# Patient Record
Sex: Male | Born: 1970 | Race: White | Hispanic: No | Marital: Single | Smoking: Former smoker
Health system: Southern US, Community
[De-identification: ages and names within clinical notes are randomized; demographics above are authoritative.]

## PROBLEM LIST (undated history)

## (undated) DIAGNOSIS — L409 Psoriasis, unspecified: Secondary | ICD-10-CM

## (undated) DIAGNOSIS — I10 Essential (primary) hypertension: Secondary | ICD-10-CM

---

## 2020-06-28 ENCOUNTER — Emergency Department: Payer: No Typology Code available for payment source

## 2020-06-28 ENCOUNTER — Encounter: Payer: Self-pay | Admitting: Medical Oncology

## 2020-06-28 ENCOUNTER — Observation Stay: Payer: No Typology Code available for payment source

## 2020-06-28 ENCOUNTER — Other Ambulatory Visit: Payer: Self-pay

## 2020-06-28 ENCOUNTER — Inpatient Hospital Stay
Admission: EM | Admit: 2020-06-28 | Discharge: 2020-06-29 | DRG: 151 | Discharge status: Against Medical Advice | Payer: No Typology Code available for payment source | Attending: Internal Medicine | Admitting: Internal Medicine

## 2020-06-28 DIAGNOSIS — K921 Melena: Secondary | ICD-10-CM | POA: Diagnosis not present

## 2020-06-28 DIAGNOSIS — F1010 Alcohol abuse, uncomplicated: Secondary | ICD-10-CM | POA: Diagnosis not present

## 2020-06-28 DIAGNOSIS — R531 Weakness: Secondary | ICD-10-CM

## 2020-06-28 DIAGNOSIS — K920 Hematemesis: Secondary | ICD-10-CM | POA: Diagnosis not present

## 2020-06-28 DIAGNOSIS — Z79899 Other long term (current) drug therapy: Secondary | ICD-10-CM

## 2020-06-28 DIAGNOSIS — Z8719 Personal history of other diseases of the digestive system: Secondary | ICD-10-CM

## 2020-06-28 DIAGNOSIS — K766 Portal hypertension: Secondary | ICD-10-CM | POA: Diagnosis present

## 2020-06-28 DIAGNOSIS — Z20822 Contact with and (suspected) exposure to covid-19: Secondary | ICD-10-CM | POA: Diagnosis present

## 2020-06-28 DIAGNOSIS — F10231 Alcohol dependence with withdrawal delirium: Secondary | ICD-10-CM

## 2020-06-28 DIAGNOSIS — M47812 Spondylosis without myelopathy or radiculopathy, cervical region: Secondary | ICD-10-CM | POA: Diagnosis present

## 2020-06-28 DIAGNOSIS — F17290 Nicotine dependence, other tobacco product, uncomplicated: Secondary | ICD-10-CM | POA: Diagnosis present

## 2020-06-28 DIAGNOSIS — K8020 Calculus of gallbladder without cholecystitis without obstruction: Secondary | ICD-10-CM | POA: Diagnosis present

## 2020-06-28 DIAGNOSIS — K7030 Alcoholic cirrhosis of liver without ascites: Secondary | ICD-10-CM | POA: Diagnosis present

## 2020-06-28 DIAGNOSIS — G8929 Other chronic pain: Secondary | ICD-10-CM | POA: Diagnosis present

## 2020-06-28 DIAGNOSIS — R188 Other ascites: Secondary | ICD-10-CM | POA: Diagnosis present

## 2020-06-28 DIAGNOSIS — G959 Disease of spinal cord, unspecified: Secondary | ICD-10-CM | POA: Diagnosis present

## 2020-06-28 DIAGNOSIS — L4050 Arthropathic psoriasis, unspecified: Secondary | ICD-10-CM | POA: Diagnosis present

## 2020-06-28 DIAGNOSIS — R7989 Other specified abnormal findings of blood chemistry: Secondary | ICD-10-CM | POA: Diagnosis present

## 2020-06-28 DIAGNOSIS — K760 Fatty (change of) liver, not elsewhere classified: Secondary | ICD-10-CM | POA: Diagnosis present

## 2020-06-28 DIAGNOSIS — R040 Epistaxis: Secondary | ICD-10-CM | POA: Diagnosis not present

## 2020-06-28 DIAGNOSIS — I10 Essential (primary) hypertension: Secondary | ICD-10-CM | POA: Diagnosis present

## 2020-06-28 DIAGNOSIS — G459 Transient cerebral ischemic attack, unspecified: Secondary | ICD-10-CM | POA: Diagnosis not present

## 2020-06-28 DIAGNOSIS — L409 Psoriasis, unspecified: Secondary | ICD-10-CM | POA: Diagnosis present

## 2020-06-28 DIAGNOSIS — R200 Anesthesia of skin: Secondary | ICD-10-CM

## 2020-06-28 DIAGNOSIS — Z9119 Patient's noncompliance with other medical treatment and regimen: Secondary | ICD-10-CM

## 2020-06-28 DIAGNOSIS — R1010 Upper abdominal pain, unspecified: Secondary | ICD-10-CM

## 2020-06-28 HISTORY — DX: Essential (primary) hypertension: I10

## 2020-06-28 HISTORY — DX: Psoriasis, unspecified: L40.9

## 2020-06-28 LAB — COMPREHENSIVE METABOLIC PANEL
ALT: 48 U/L — ABNORMAL HIGH (ref 0–44)
ALT: 47 U/L — ABNORMAL HIGH (ref 0–44)
AST: 77 U/L — ABNORMAL HIGH (ref 15–41)
AST: 70 U/L — ABNORMAL HIGH (ref 15–41)
Albumin: 3.9 g/dL (ref 3.5–5.0)
Albumin: 3.8 g/dL (ref 3.5–5.0)
Alkaline Phosphatase: 89 U/L (ref 38–126)
Alkaline Phosphatase: 80 U/L (ref 38–126)
Anion gap: 10 (ref 5–15)
Anion gap: 11 (ref 5–15)
BUN: 9 mg/dL (ref 6–20)
BUN: 8 mg/dL (ref 6–20)
CO2: 29 mmol/L (ref 22–32)
CO2: 25 mmol/L (ref 22–32)
Calcium: 8.7 mg/dL — ABNORMAL LOW (ref 8.9–10.3)
Calcium: 8.6 mg/dL — ABNORMAL LOW (ref 8.9–10.3)
Chloride: 102 mmol/L (ref 98–111)
Chloride: 103 mmol/L (ref 98–111)
Creatinine, Ser: 0.98 mg/dL (ref 0.61–1.24)
Creatinine, Ser: 0.83 mg/dL (ref 0.61–1.24)
GFR, Estimated: >60 mL/min (ref >60)
GFR, Estimated: >60 mL/min (ref >60)
Glucose, Bld: 111 mg/dL — ABNORMAL HIGH (ref 70–99)
Glucose, Bld: 94 mg/dL (ref 70–99)
Potassium: 3.6 mmol/L (ref 3.5–5.1)
Potassium: 3.3 mmol/L — ABNORMAL LOW (ref 3.5–5.1)
Sodium: 141 mmol/L (ref 135–145)
Sodium: 139 mmol/L (ref 135–145)
Total Bilirubin: 1.1 mg/dL (ref 0.3–1.2)
Total Bilirubin: 1.4 mg/dL — ABNORMAL HIGH (ref 0.3–1.2)
Total Protein: 8.1 g/dL (ref 6.5–8.1)
Total Protein: 7.6 g/dL (ref 6.5–8.1)

## 2020-06-28 LAB — CBC WITH DIFFERENTIAL/PLATELET
Abs Immature Granulocytes: 0.04 10*3/uL (ref 0.00–0.07)
Basophils Absolute: 0.1 10*3/uL (ref 0.0–0.1)
Basophils Relative: 1 %
Eosinophils Absolute: 0.2 10*3/uL (ref 0.0–0.5)
Eosinophils Relative: 2 %
HCT: 42.3 % (ref 39.0–52.0)
Hemoglobin: 13.6 g/dL (ref 13.0–17.0)
Immature Granulocytes: 1 %
Lymphocytes Relative: 16 %
Lymphs Abs: 1.3 10*3/uL (ref 0.7–4.0)
MCH: 29.4 pg (ref 26.0–34.0)
MCHC: 32.2 g/dL (ref 30.0–36.0)
MCV: 91.6 fL (ref 80.0–100.0)
Monocytes Absolute: 0.8 10*3/uL (ref 0.1–1.0)
Monocytes Relative: 9 %
Neutro Abs: 6 10*3/uL (ref 1.7–7.7)
Neutrophils Relative %: 71 %
Platelets: 209 10*3/uL (ref 150–400)
RBC: 4.62 MIL/uL (ref 4.22–5.81)
RDW: 13.2 % (ref 11.5–15.5)
WBC: 8.3 10*3/uL (ref 4.0–10.5)
nRBC: 0 % (ref 0.0–0.2)

## 2020-06-28 LAB — TYPE AND SCREEN
ABO/RH(D): B POS
Antibody Screen: NEGATIVE

## 2020-06-28 LAB — RESP PANEL BY RT-PCR (FLU A&B, COVID) ARPGX2
Influenza A by PCR: NEGATIVE
Influenza B by PCR: NEGATIVE
SARS Coronavirus 2 by RT PCR: NEGATIVE

## 2020-06-28 LAB — CBC
HCT: 38.1 % — ABNORMAL LOW (ref 39.0–52.0)
Hemoglobin: 12.6 g/dL — ABNORMAL LOW (ref 13.0–17.0)
MCH: 30.1 pg (ref 26.0–34.0)
MCHC: 33.1 g/dL (ref 30.0–36.0)
MCV: 91.1 fL (ref 80.0–100.0)
Platelets: 200 10*3/uL (ref 150–400)
RBC: 4.18 MIL/uL — ABNORMAL LOW (ref 4.22–5.81)
RDW: 13.1 % (ref 11.5–15.5)
WBC: 9.5 10*3/uL (ref 4.0–10.5)
nRBC: 0 % (ref 0.0–0.2)

## 2020-06-28 LAB — AMMONIA: Ammonia: 14 umol/L (ref 9–35)

## 2020-06-28 LAB — VITAMIN B12: Vitamin B-12: 301 pg/mL (ref 180–914)

## 2020-06-28 LAB — MAGNESIUM: Magnesium: 1.6 mg/dL — ABNORMAL LOW (ref 1.7–2.4)

## 2020-06-28 LAB — IRON AND TIBC
Iron: 59 ug/dL (ref 45–182)
Saturation Ratios: 12 % — ABNORMAL LOW (ref 17.9–39.5)
TIBC: 477 ug/dL — ABNORMAL HIGH (ref 250–450)
UIBC: 418 ug/dL

## 2020-06-28 LAB — PROTIME-INR
INR: 1.2 (ref 0.8–1.2)
Prothrombin Time: 15.1 seconds (ref 11.4–15.2)

## 2020-06-28 LAB — TROPONIN I (HIGH SENSITIVITY)
Troponin I (High Sensitivity): 5 ng/L (ref <18)
Troponin I (High Sensitivity): 4 ng/L (ref <18)

## 2020-06-28 LAB — FOLATE: Folate: 9 ng/mL (ref >5.9)

## 2020-06-28 LAB — FERRITIN: Ferritin: 16 ng/mL — ABNORMAL LOW (ref 24–336)

## 2020-06-28 LAB — PHOSPHORUS: Phosphorus: 3 mg/dL (ref 2.5–4.6)

## 2020-06-28 LAB — APTT: aPTT: 43 seconds — ABNORMAL HIGH (ref 24–36)

## 2020-06-28 IMAGING — CT CT HEAD W/O CM
3 series · 16 of 47 positions shown, 19 images · non-contrast
Comparison: None.

CLINICAL DATA: Left-sided numbness and tingling since this a.m.

EXAM:
CT HEAD WITHOUT CONTRAST
TECHNIQUE: Contiguous axial images were obtained from the base of the skull
through the vertex without intravenous contrast.

[Series 2: head wo · axial · 0.41mm/px · z∈[+181,+306]mm · 10 of 30 slices shown, 13 images]
[im 3/30  brain]
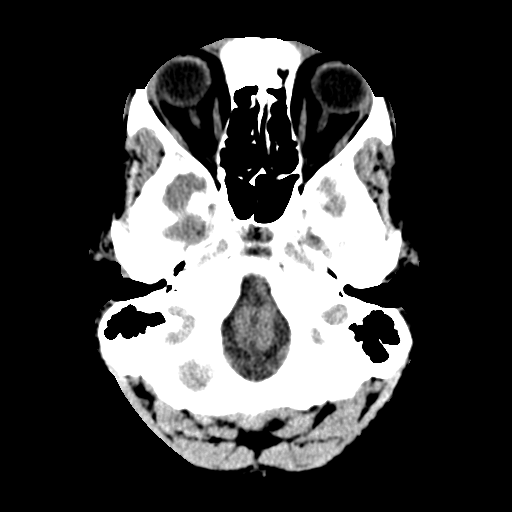
[im 3/30  bone]
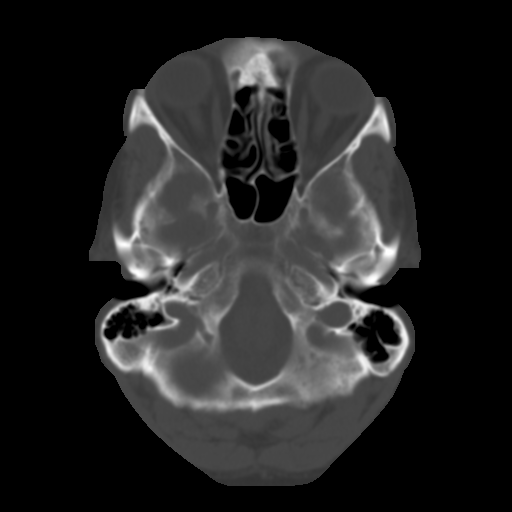
[im 6/30  brain]
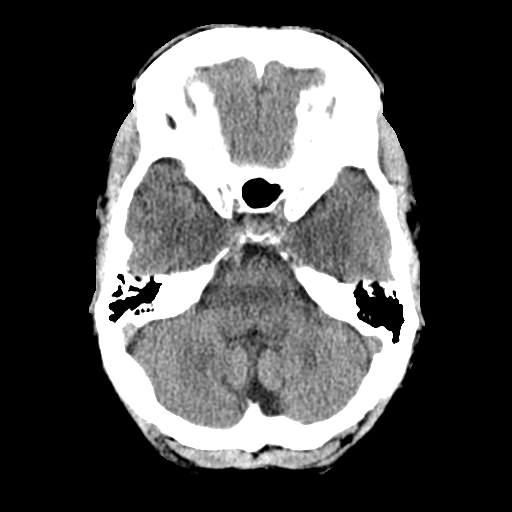
[im 9/30  brain]
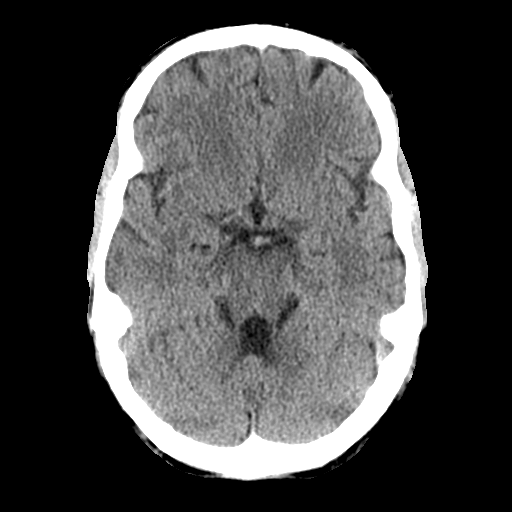
[im 11/30  brain]
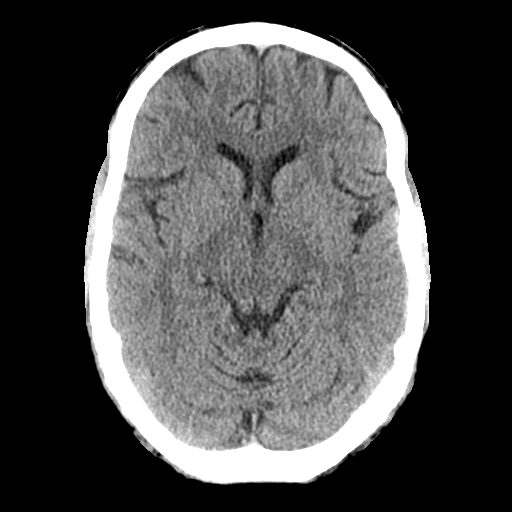
[im 14/30  brain]
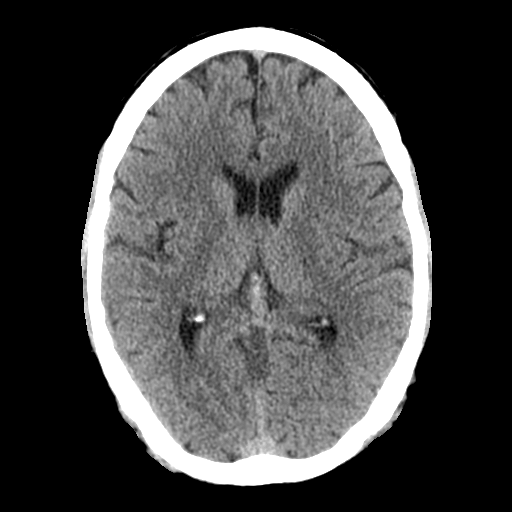
[im 14/30  bone]
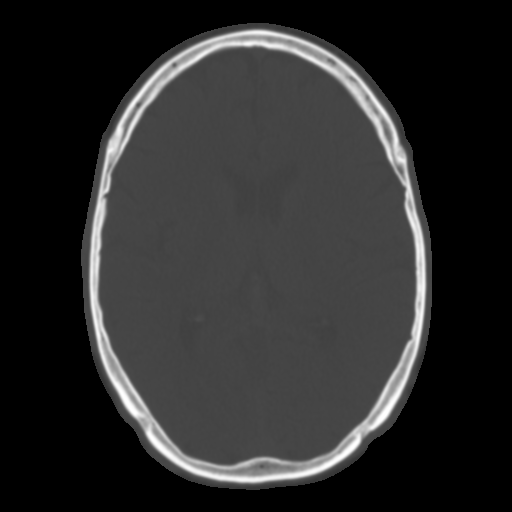
[im 17/30  brain]
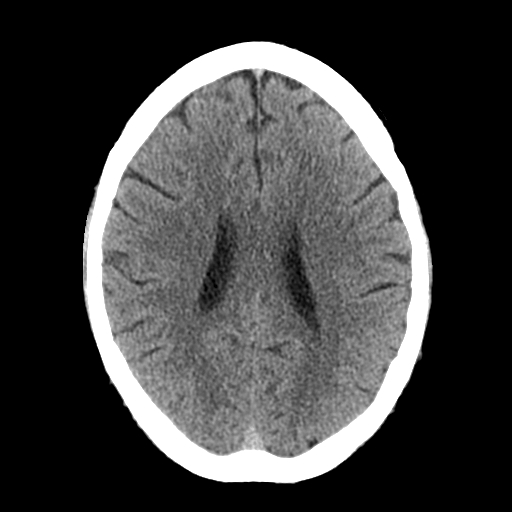
[im 20/30  brain]
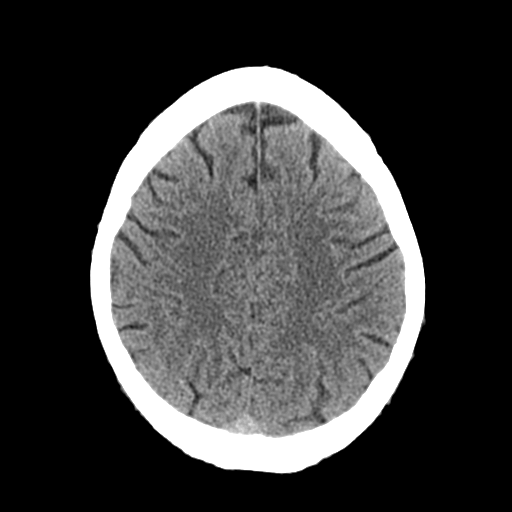
[im 23/30  brain]
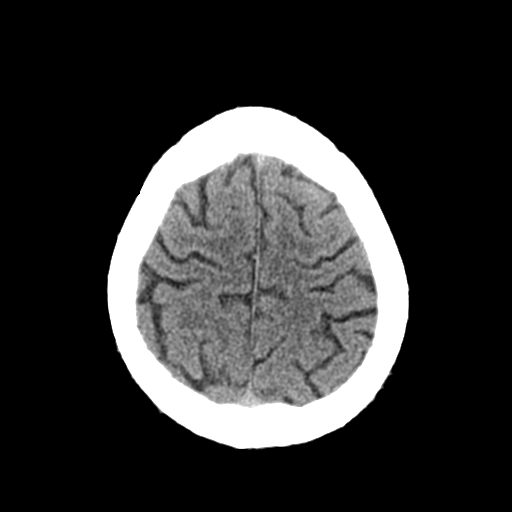
[im 25/30  brain]
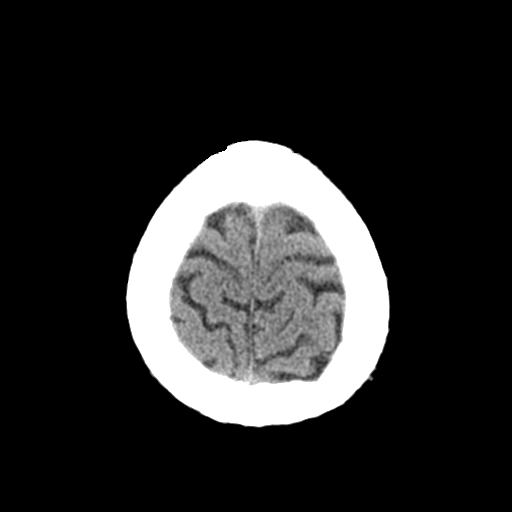
[im 25/30  bone]
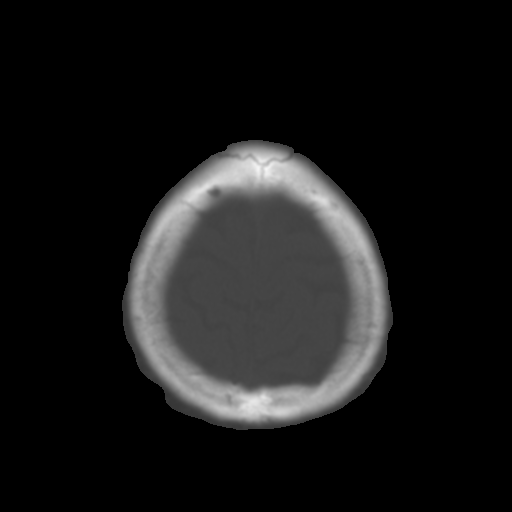
[im 28/30  brain]
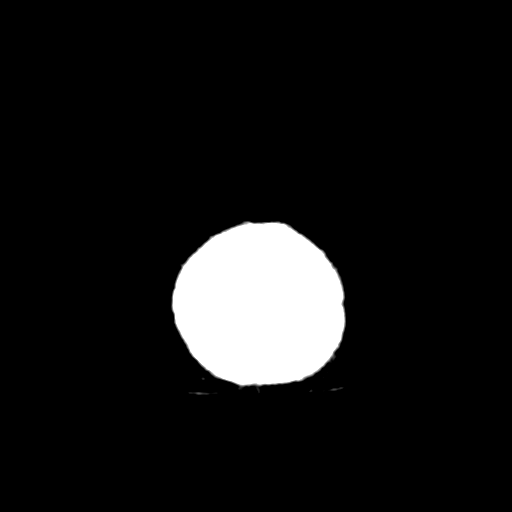

[Series 4: coronal soft tissue · coronal · 0.30mm/px · 3 of 67 slices shown]
[im 23/67  brain]
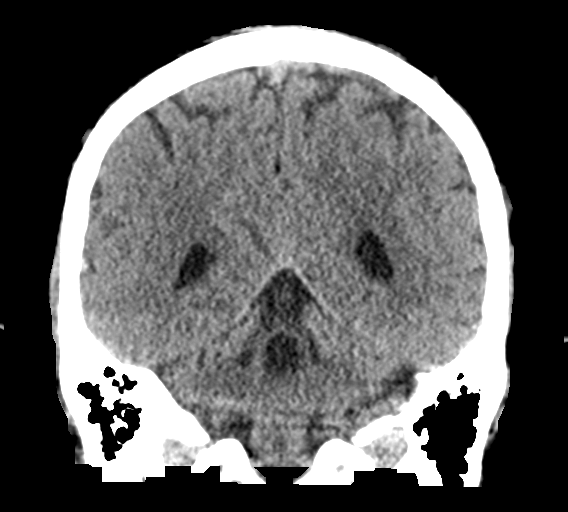
[im 30/67  brain]
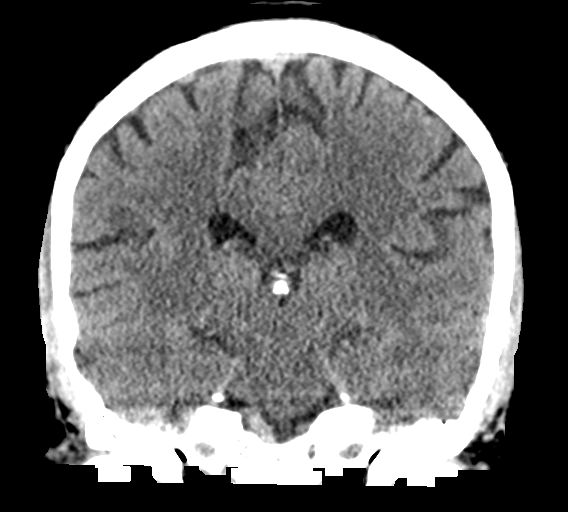
[im 37/67  brain]
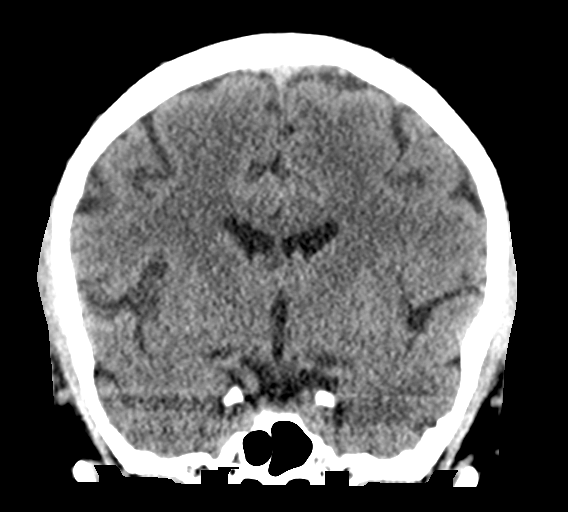

[Series 5: sagittal soft tissue · sagittal · 0.30mm/px · 3 of 57 slices shown]
[im 19/57  brain]
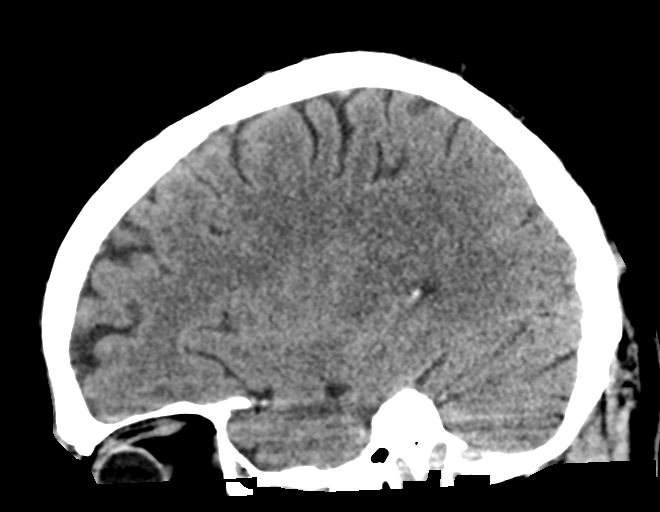
[im 29/57  brain]
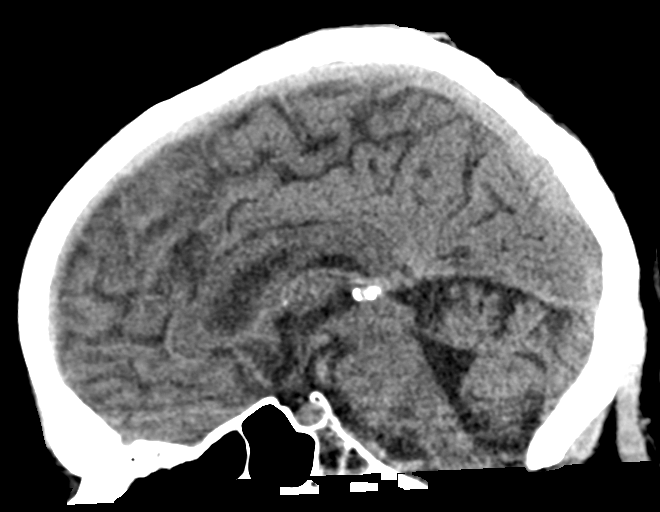
[im 38/57  brain]
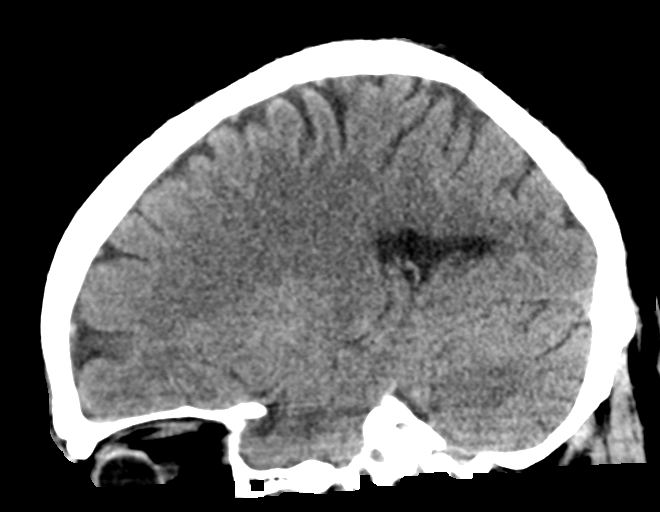

[16 of 47 positions shown; findings below may reference images not displayed]

FINDINGS: Brain: No evidence of acute infarction, hemorrhage, hydrocephalus,
extra-axial collection or mass lesion/mass effect.

Vascular: No hyperdense vessel or unexpected calcification.

Skull: Normal. Negative for fracture or focal lesion.

Sinuses/Orbits: No acute finding.

Other: None.
IMPRESSION: No acute intracranial pathology.

## 2020-06-28 IMAGING — MR MR HEAD W/O CM
11 series · 48 of 48 positions shown · non-contrast
Comparison: None.

CLINICAL DATA: Left-sided numbness

EXAM:
MRI HEAD WITHOUT CONTRAST
TECHNIQUE: Multiplanar, multiecho pulse sequences of the brain and surrounding
structures were obtained without intravenous contrast.

[Series 5: ax dwi_tracew · axial · 3.0mm · 0.65mm/px · z∈[-62,+93]mm · 4 of 48 slices shown]
[im 1/48]
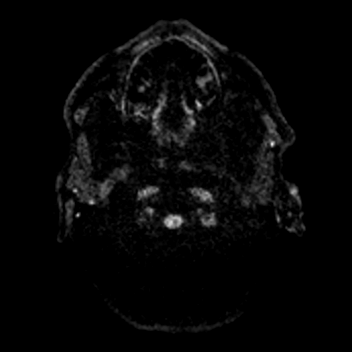
[im 16/48]
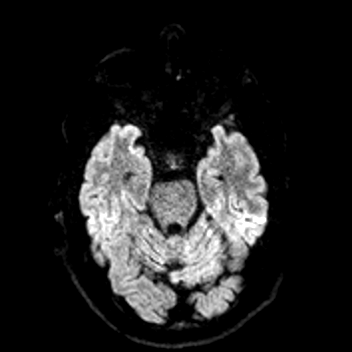
[im 32/48]
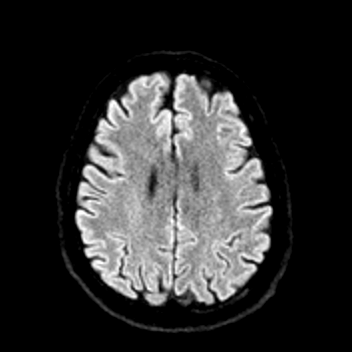
[im 48/48]
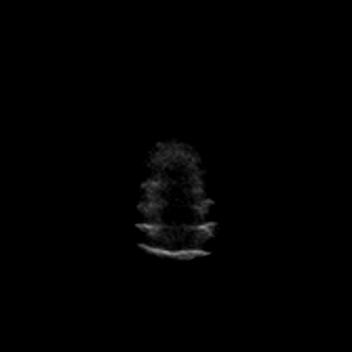

[Series 6: ax dwi_adc · axial · 3.0mm · 0.65mm/px · z∈[-62,+93]mm · 4 of 48 slices shown]
[im 1/48]
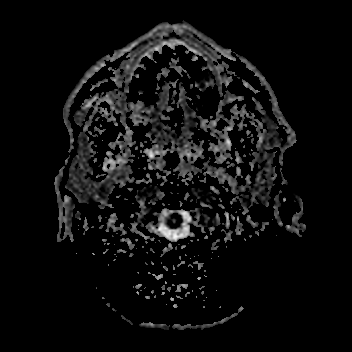
[im 16/48]
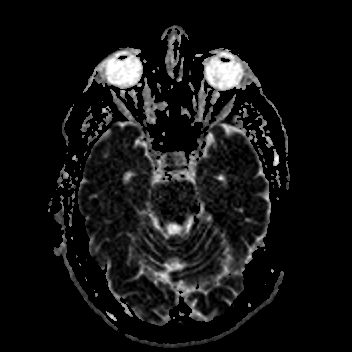
[im 32/48]
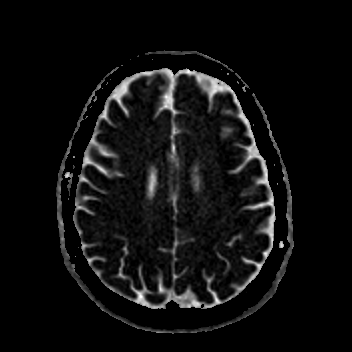
[im 48/48]
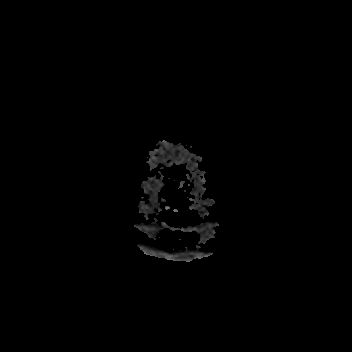

[Series 7: cor dwi_tracew · coronal · 5.0mm · 0.65mm/px · 3 of 40 slices shown]
[im 1/40]
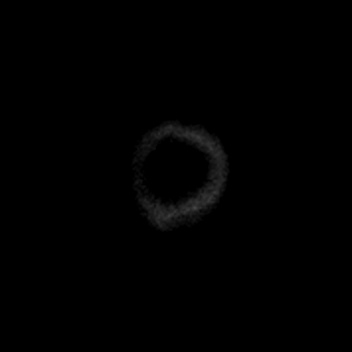
[im 20/40]
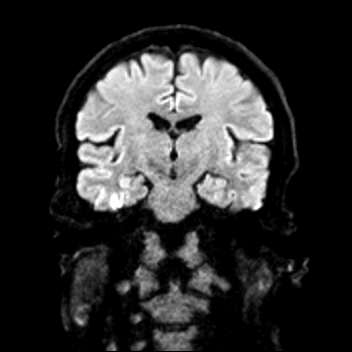
[im 40/40]
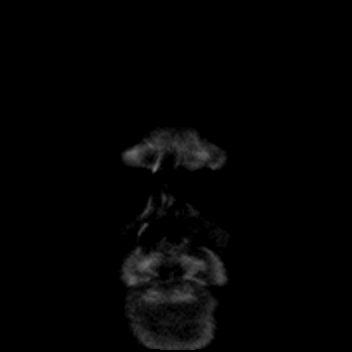

[Series 8: cor dwi_adc · coronal · 5.0mm · 0.65mm/px · 3 of 40 slices shown]
[im 1/40]
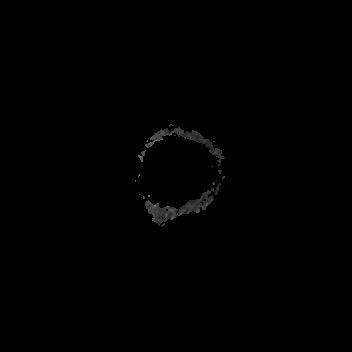
[im 20/40]
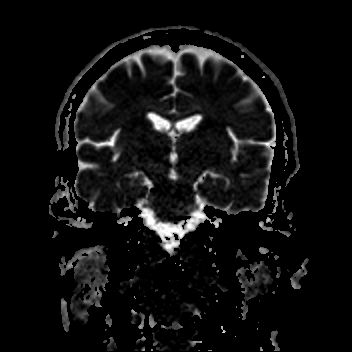
[im 40/40]
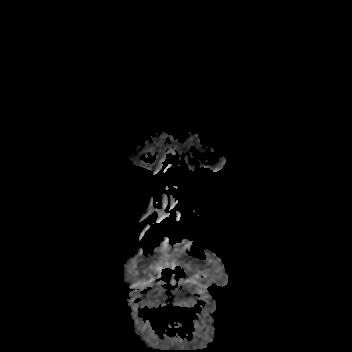

[Series 9: T1 · sagittal · 5.0mm · 0.62mm/px · 2 of 23 slices shown (1 of 2)]
[im 1/23]
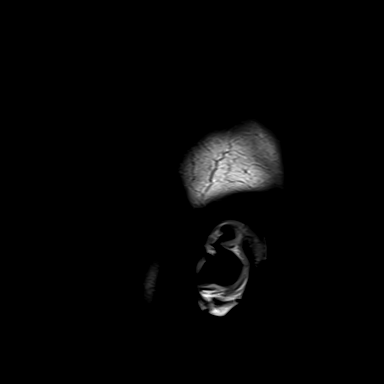
[im 23/23]
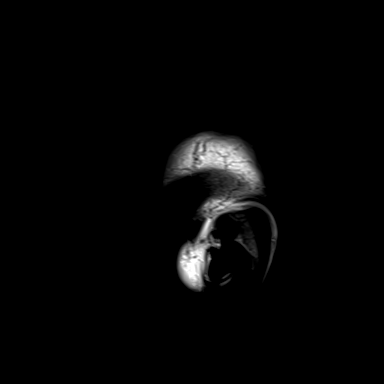

[Series 10: T2 · axial · 5.0mm · 0.53mm/px · z∈[-57,+87]mm · 2 of 25 slices shown (1 of 2)]
[im 1/25]
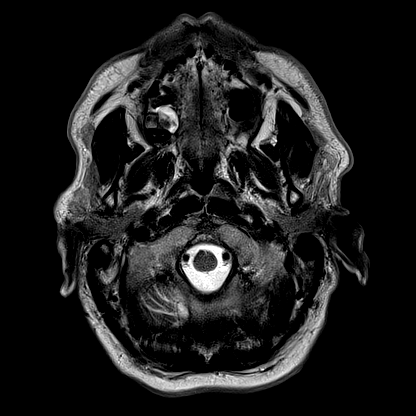
[im 25/25]
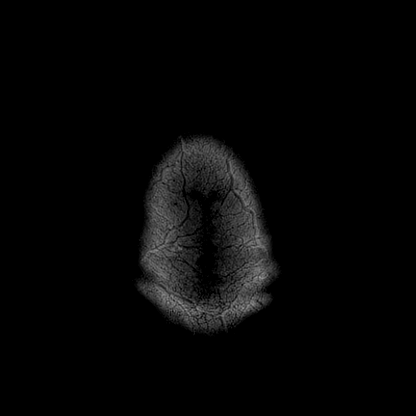

[Series 12: pha_images · axial · 3.0mm · 0.90mm/px · z∈[-73,+95]mm · 5 of 57 slices shown]
[im 1/57]
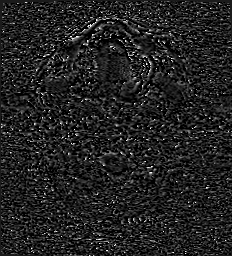
[im 15/57]
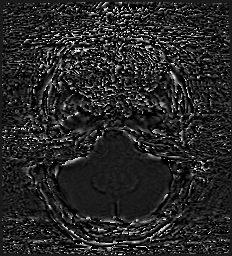
[im 29/57]
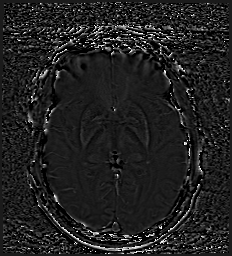
[im 43/57]
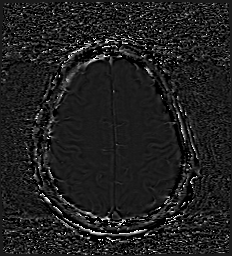
[im 57/57]
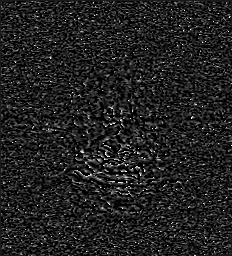

[Series 13: swi_images · axial · 3.0mm · 0.90mm/px · z∈[-73,+101]mm · 5 of 59 slices shown]
[im 1/59]
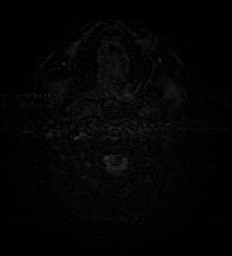
[im 15/59]
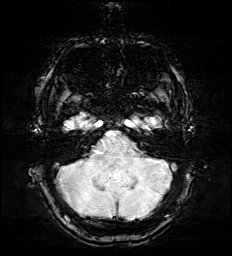
[im 30/59]
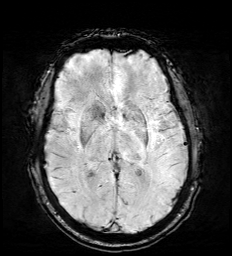
[im 44/59]
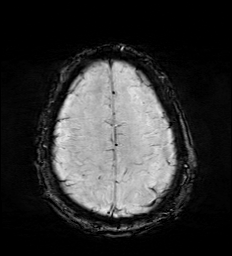
[im 59/59]
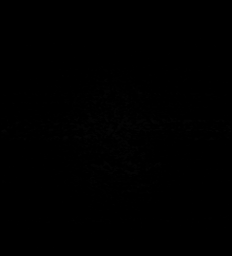

[Series 15: FLAIR · axial · 3.0mm · 0.53mm/px · z∈[-66,+96]mm · 4 of 55 slices shown]
[im 1/55]
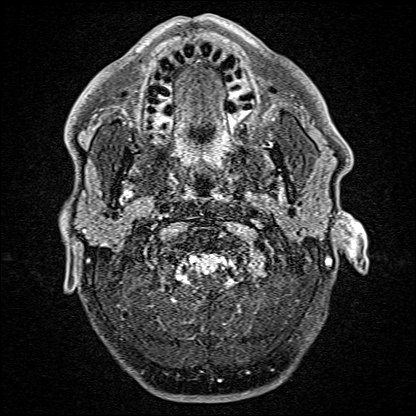
[im 19/55]
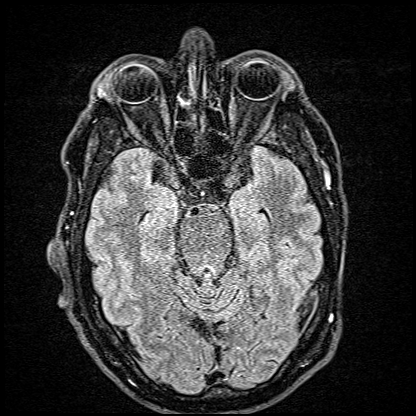
[im 37/55]
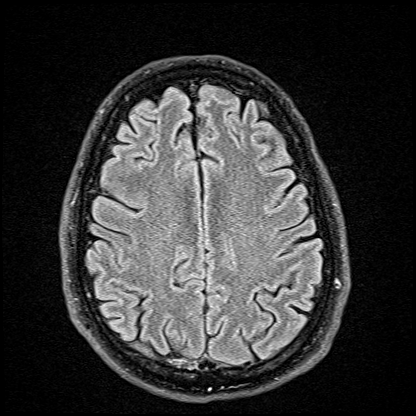
[im 55/55]
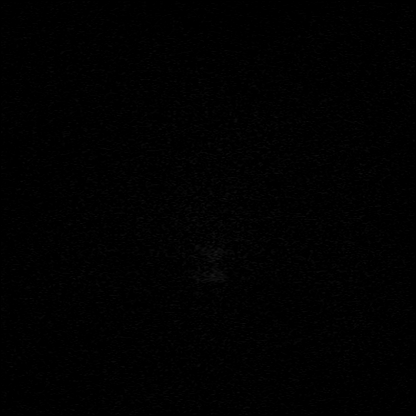

[Series 16: T1 · axial · 1.0mm · 0.98mm/px · z∈[-72,+103]mm · 14 of 176 slices shown (2 of 2)]
[im 1/176]
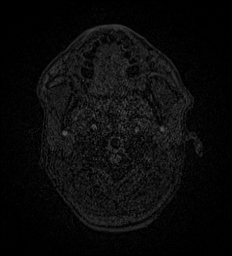
[im 14/176]
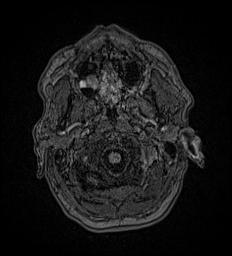
[im 27/176]
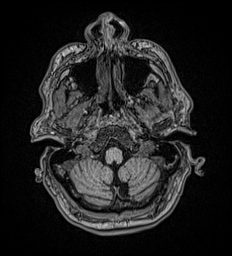
[im 41/176]
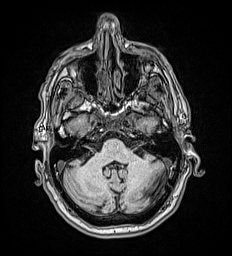
[im 54/176]
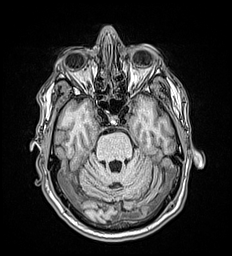
[im 68/176]
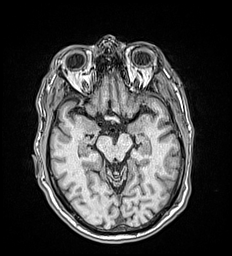
[im 81/176]
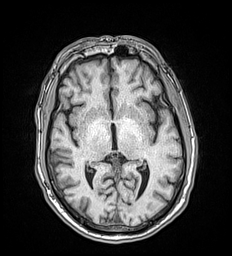
[im 95/176]
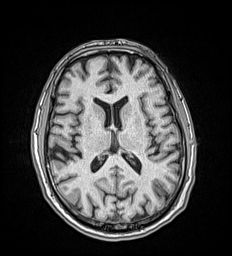
[im 108/176]
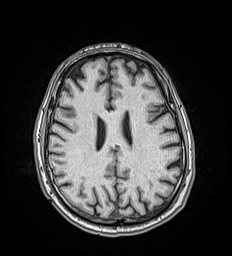
[im 122/176]
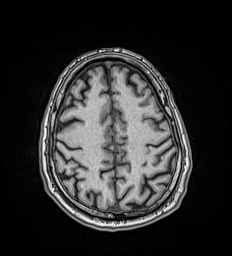
[im 135/176]
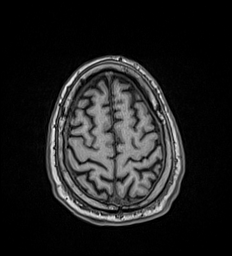
[im 149/176]
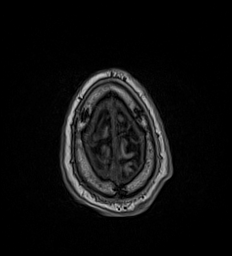
[im 162/176]
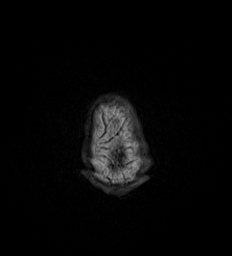
[im 176/176]
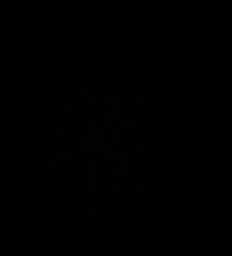

[Series 17: T2 · coronal · 5.0mm · 0.57mm/px · 2 of 29 slices shown (2 of 2)]
[im 1/29]
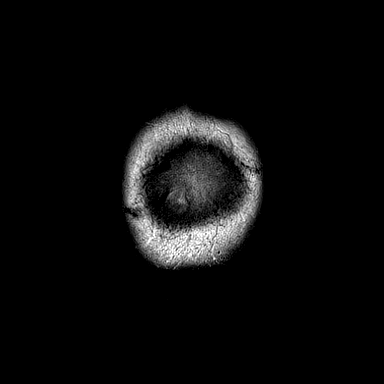
[im 29/29]
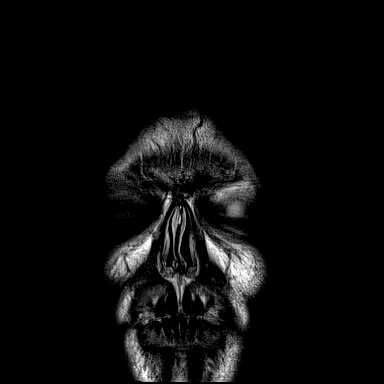

[48 of 48 positions shown; findings below may reference images not displayed]

FINDINGS: Brain: There is no acute infarction or intracranial hemorrhage.
There is no intracranial mass, mass effect, or edema. There is no
hydrocephalus or extra-axial fluid collection. Ventricles and sulci
are normal in size and configuration.

Vascular: Major vessel flow voids at the skull base are preserved.

Skull and upper cervical spine: Normal marrow signal is preserved.

Sinuses/Orbits: Minor mucosal thickening.  Orbits are unremarkable.

Other: Sella is unremarkable.  Mastoid air cells are clear.
IMPRESSION: No evidence of recent infarction, hemorrhage, or mass.

## 2020-06-28 IMAGING — CT CT ANGIO HEAD-NECK (W OR W/O PERF)
2 of 7 series · 8 of 33 positions shown · IV contrast (APPLIED)
Comparison: Head CT earlier same day.  MRI earlier same day.

CLINICAL DATA: Woke today with numbness and tingling of the left
side of the body.

EXAM:
CT ANGIOGRAPHY HEAD AND NECK
TECHNIQUE: Multidetector CT imaging of the head and neck was performed using
the standard protocol during bolus administration of intravenous
contrast. Multiplanar CT image reconstructions and MIPs were
obtained to evaluate the vascular anatomy. Carotid stenosis
measurements (when applicable) are obtained utilizing NASCET
criteria, using the distal internal carotid diameter as the
denominator.
CONTRAST:  75mL OMNIPAQUE IOHEXOL 350 MG/ML SOLN

[Series 4: cta head neck · axial · 0.59mm/px · z∈[-225,-101]mm · 2 of 188 slices shown]
[im 63/188  soft-tissue]
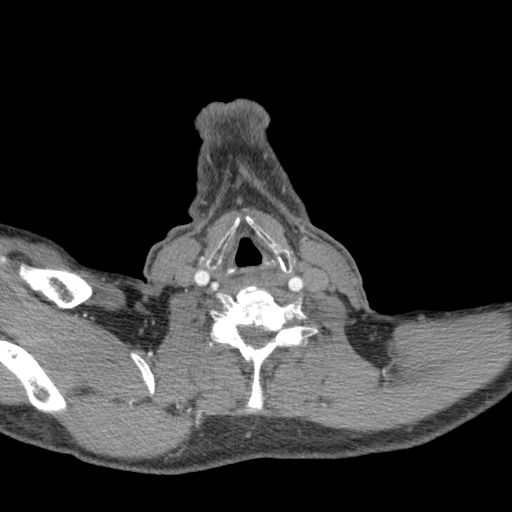
[im 125/188  soft-tissue]
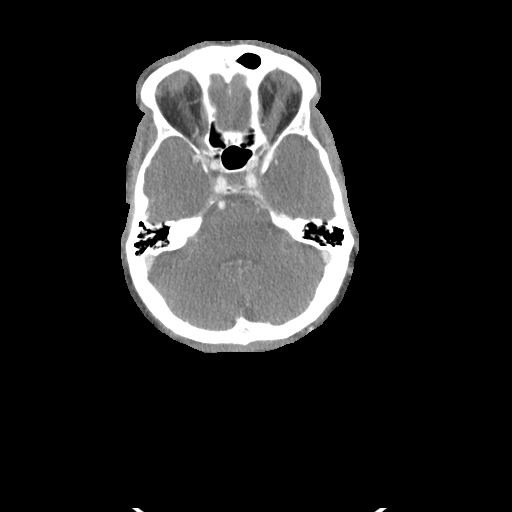

[Series 6: ax thin · axial · 0.49mm/px · z∈[-306,-51]mm · 6 of 359 slices shown]
[im 52/359  soft-tissue]
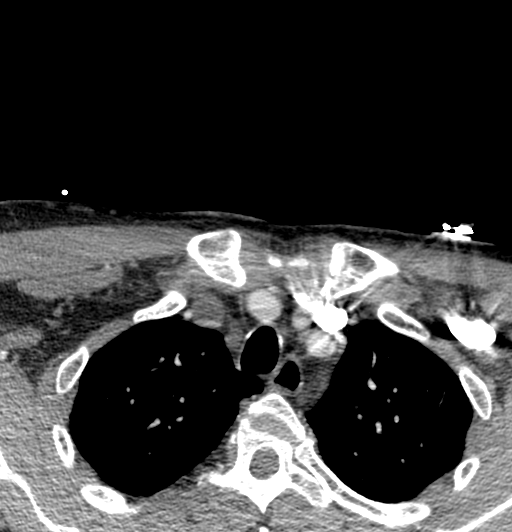
[im 103/359  bone]
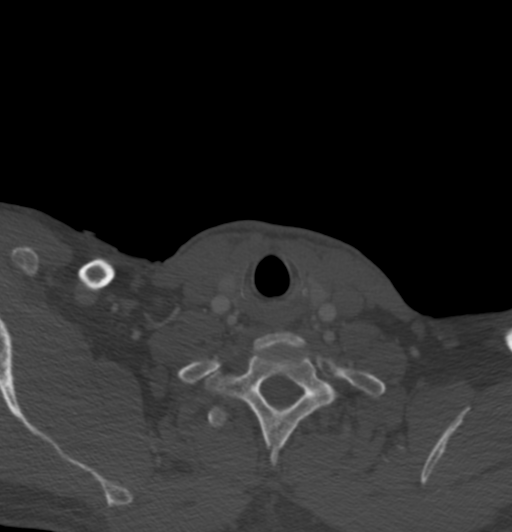
[im 154/359  soft-tissue]
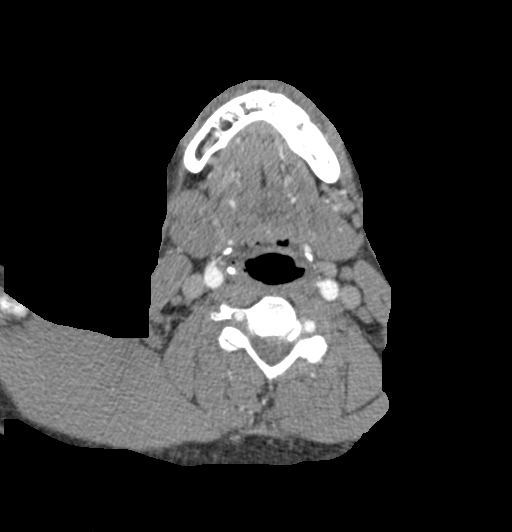
[im 205/359  bone]
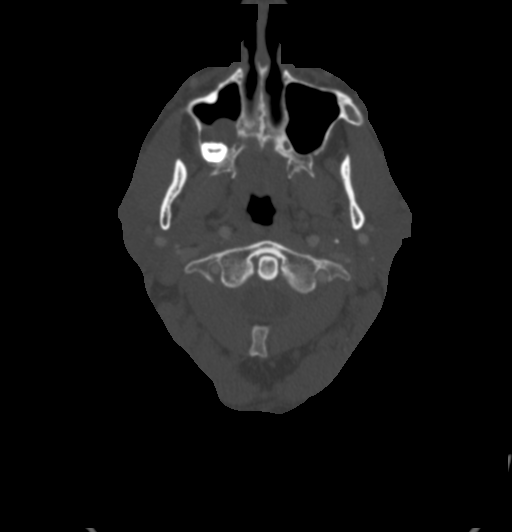
[im 256/359  soft-tissue]
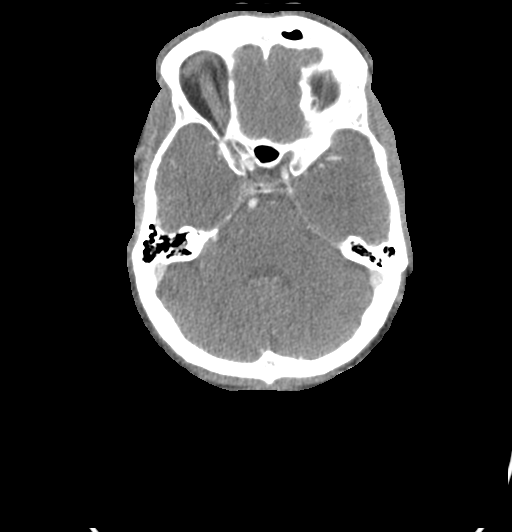
[im 307/359  bone]
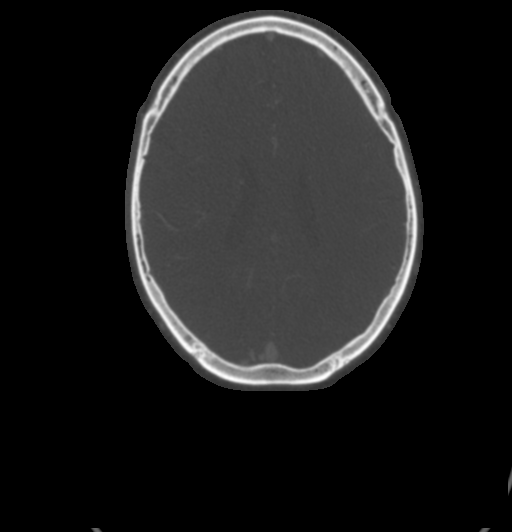

[8 of 33 positions shown; findings below may reference images not displayed]

FINDINGS: CTA NECK FINDINGS

Aortic arch: Normal. No visible atherosclerotic change. Branching
pattern is normal.

Right carotid system: Common carotid artery widely patent to the
bifurcation. The carotid bifurcation is normal without soft or
calcified plaque. Cervical ICA is normal.

Left carotid system: Common carotid artery widely patent to the
bifurcation. Carotid bifurcation is normal. No atherosclerotic
plaque. Cervical ICA is normal.

Vertebral arteries: Both vertebral artery origins are widely patent.
Both vertebral arteries are normal through the cervical region to
the foramen magnum.

Skeleton: Mid cervical spondylosis with moderate stenosis at C5-6
and C6-7.

Other neck: No mass or lymphadenopathy.

Upper chest: Normal

Review of the MIP images confirms the above findings

CTA HEAD FINDINGS

Anterior circulation: Both internal carotid arteries are patent
through the skull base and siphon regions. The anterior and middle
cerebral vessels are patent without proximal stenosis, aneurysm or
vascular malformation. No large or medium vessel occlusion.

Posterior circulation: Both vertebral arteries widely patent to the
basilar. No basilar stenosis. Posterior circulation branch vessels
appear normal.

Venous sinuses: Patent and normal.

Anatomic variants: None significant.

Review of the MIP images confirms the above findings
IMPRESSION: 1. Normal CT angiography of the neck and head. No intracranial large
or medium vessel occlusion or correctable proximal stenosis.
2. Mid cervical spondylosis with moderate stenosis at C5-6 and C6-7.

## 2020-06-28 IMAGING — US US HEPATIC LIVER DOPPLER
1 series · 13 of 25 positions shown · non-contrast
Comparison: None.

CLINICAL DATA: Abdominal pain, hemoptysis, history of GI bleed

EXAM:
DUPLEX ULTRASOUND OF LIVER
TECHNIQUE: Color and duplex Doppler ultrasound was performed to evaluate the
hepatic in-flow and out-flow vessels.

[Series 1: us liver doppler · 13 of 63 slices shown]
[im 1/63]
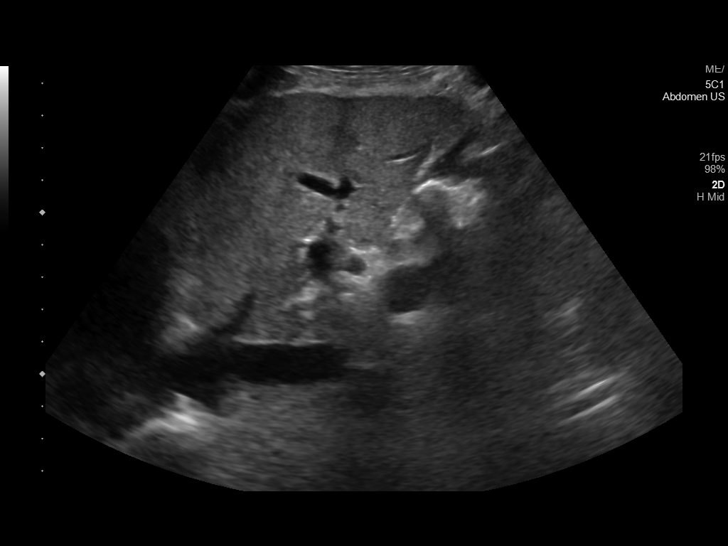
[im 6/63]
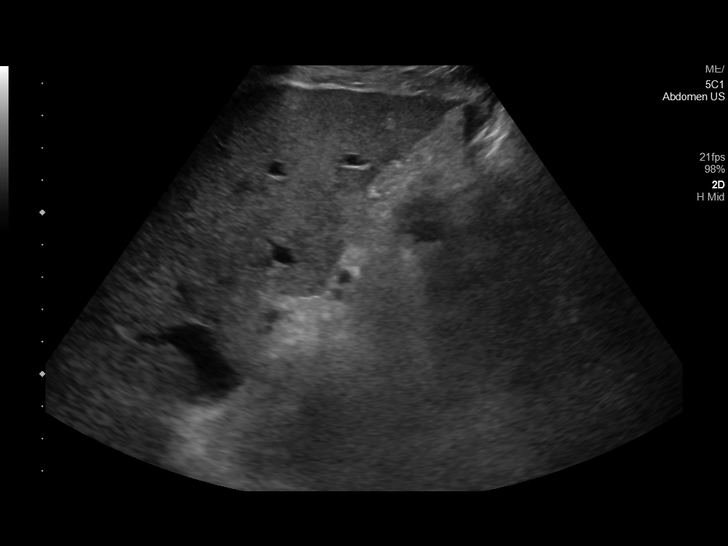
[im 11/63]
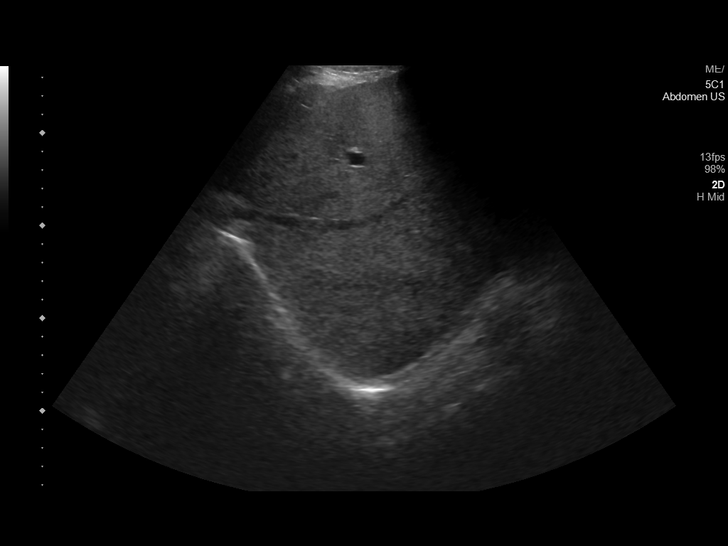
[im 16/63]
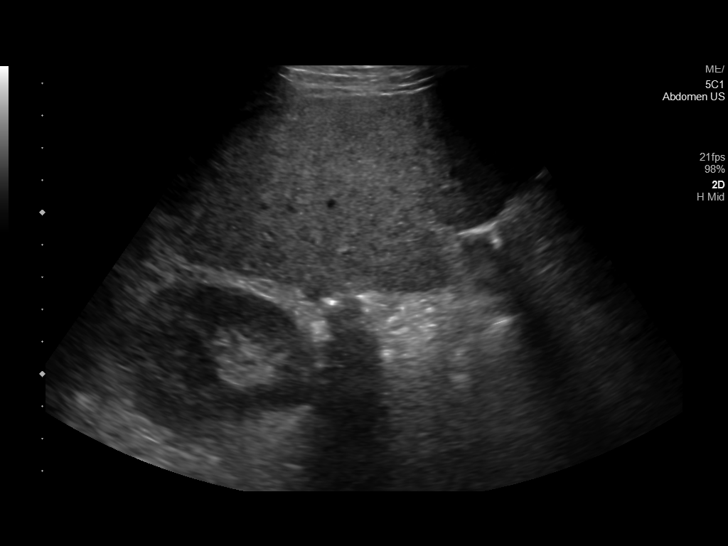
[im 21/63]
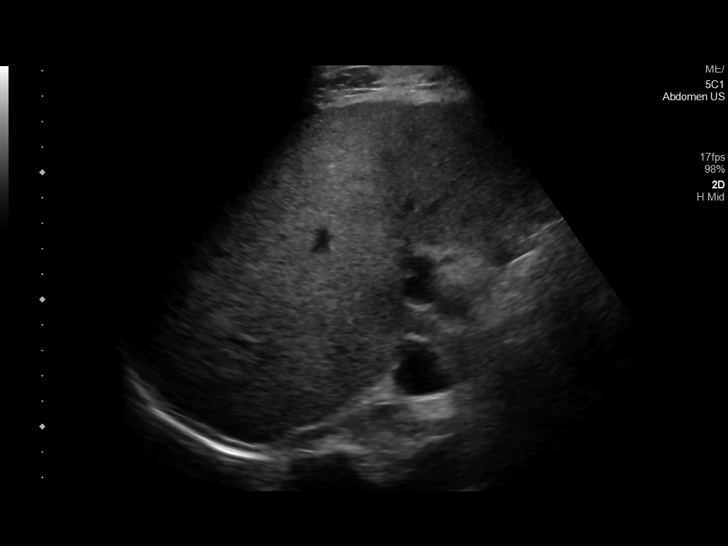
[im 26/63]
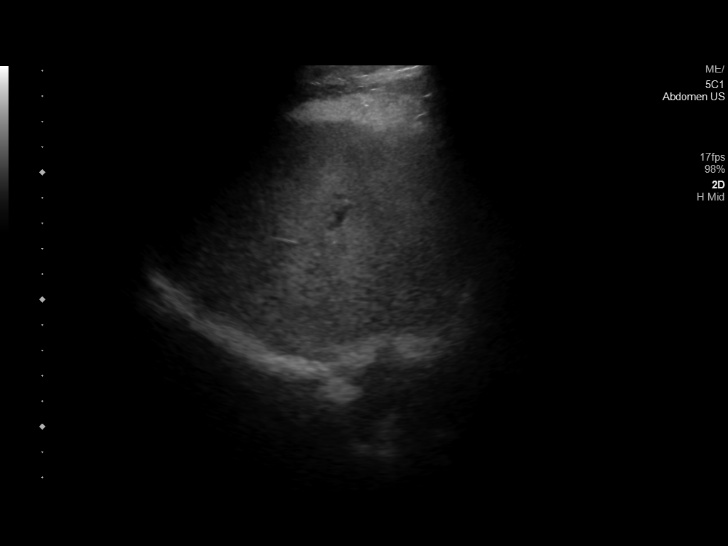
[im 32/63]
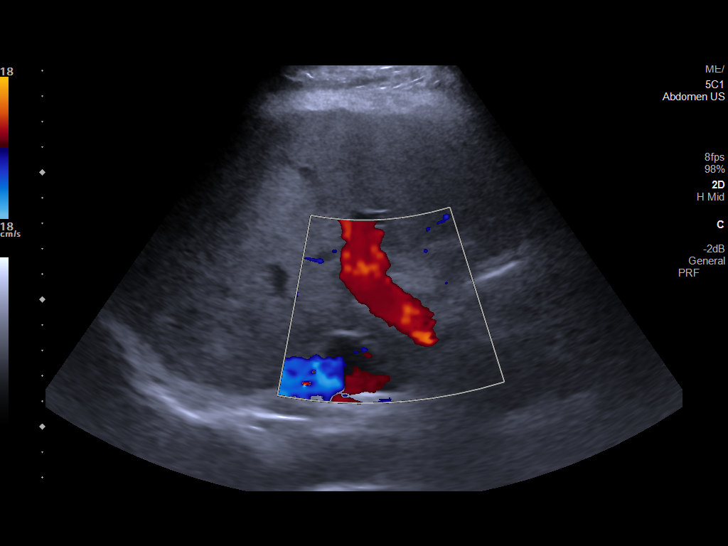
[im 37/63]
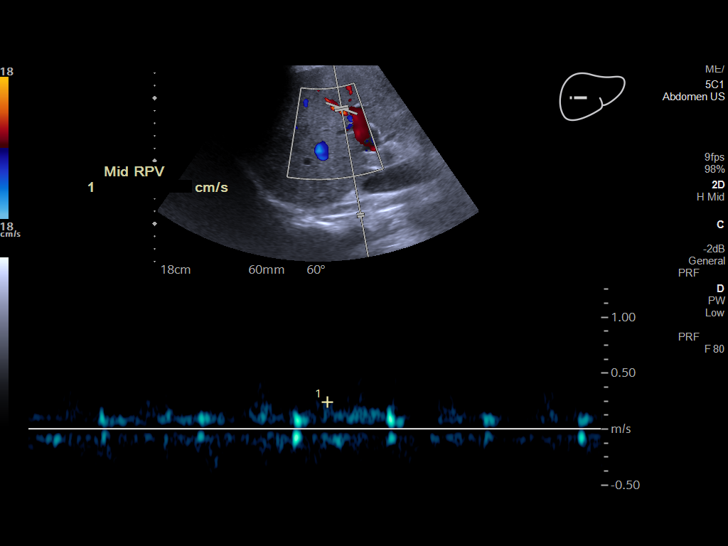
[im 42/63]
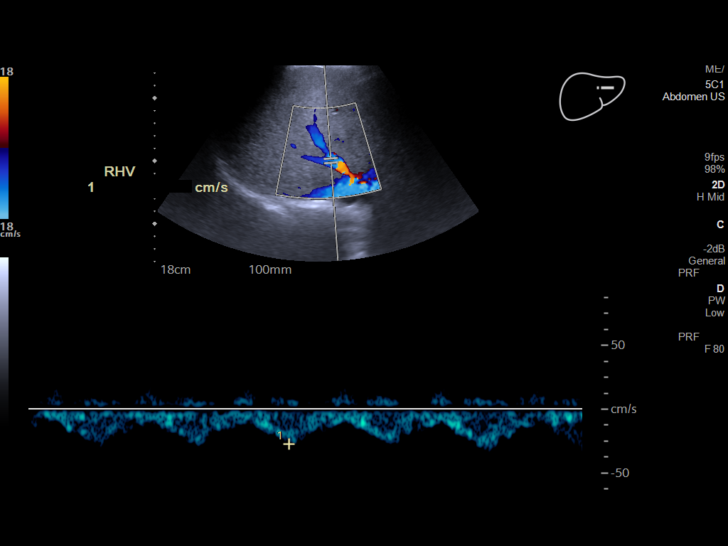
[im 47/63]
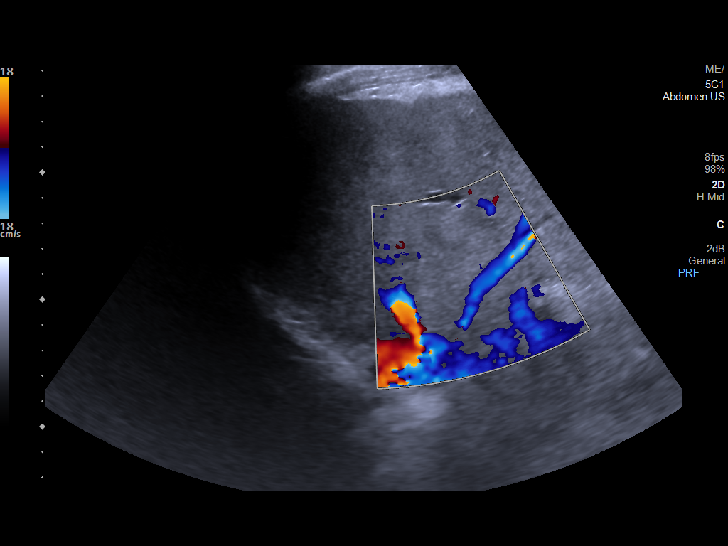
[im 52/63]
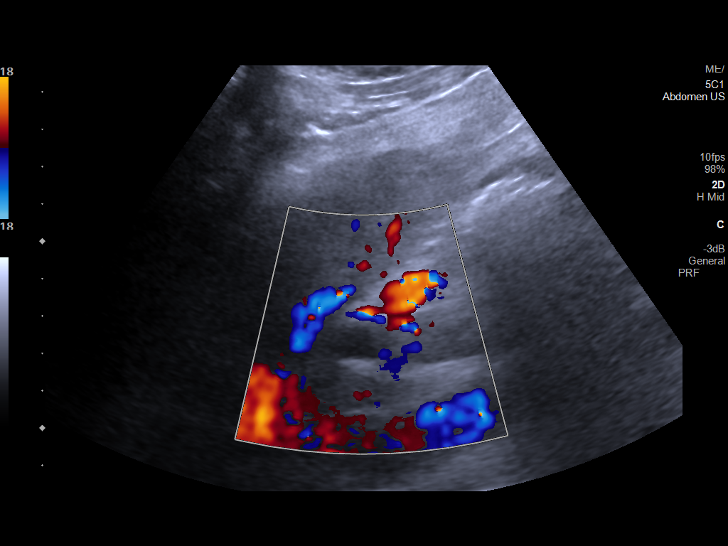
[im 57/63]
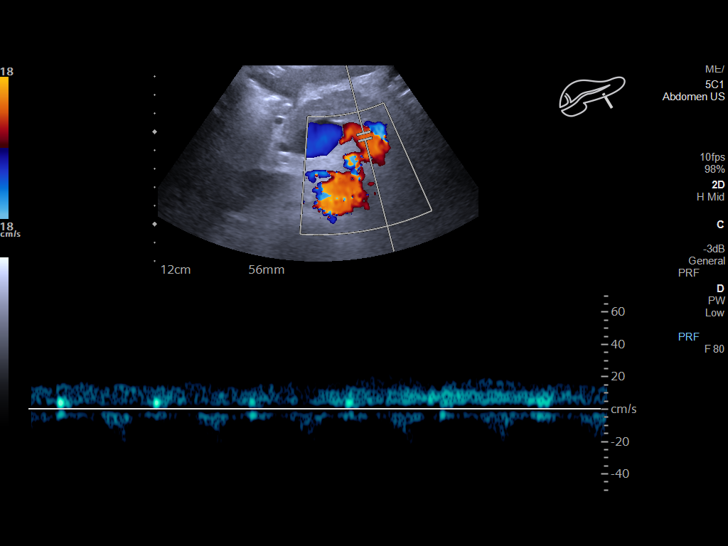
[im 63/63]
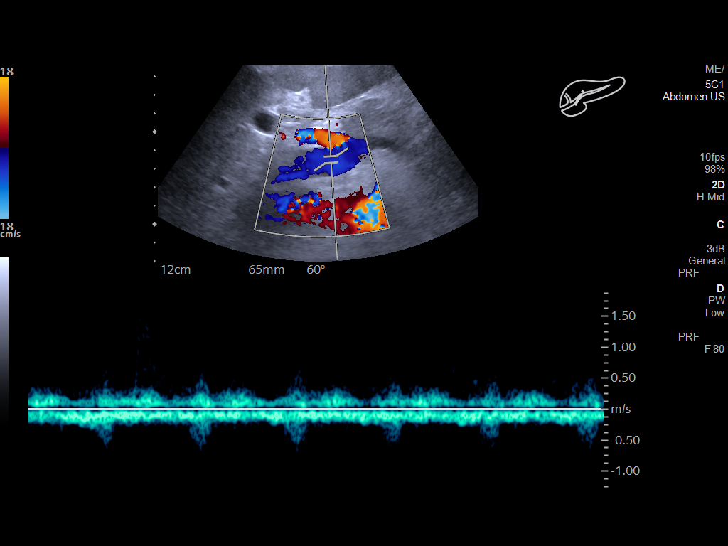

[13 of 25 positions shown; findings below may reference images not displayed]

FINDINGS: Liver: Increased echogenicity surface nodularity suggesting hepatic
steatosis. No intrahepatic biliary dilatation.

No large focal mass or intrahepatic biliary dilatation

Main Portal Vein size: 1.7 cm

Portal Vein Velocities

Main Prox:  33 cm/sec

Main Mid: 34 cm/sec

Main Dist:  31 cm/sec
Right: 24 cm/sec
Left: 416 cm/sec

Hepatic Vein Velocities

Right:  27 cm/sec

Middle:  24 cm/sec

Left:  36 cm/sec

IVC: Present and patent with normal respiratory phasicity.

Hepatic Artery Velocity:  130 cm/sec

Splenic Vein Velocity:  22 cm/sec

Spleen: 7.9 cm x 11.7 cm x 5.5 cm with a total volume of 267 cm^3
(411 cm^3 is upper limit normal)

Portal Vein Occlusion/Thrombus: No

Splenic Vein Occlusion/Thrombus: No

Ascites: None

Varices: None

Gallbladder is nondistended but appears to contain small gallstones.

Patent portal, hepatic and splenic veins with normal directional
flow. Portal vein is dilated measuring 1.7 cm in diameter suggesting
component of portal hypertension. No portal vein occlusion or
thrombus.
IMPRESSION: Findings suggesting hepatic cirrhosis.

Dilated portal vein suggesting component of portal hypertension but
with preserved hepatopetal flow.

## 2020-06-28 MED ORDER — ACETAMINOPHEN 650 MG RE SUPP: 325.0000 mg | Freq: Four times a day (QID) | RECTAL | Status: DC | PRN

## 2020-06-28 MED ORDER — LORAZEPAM 2 MG/ML IJ SOLN: 1.0000 mg | INTRAMUSCULAR | Status: DC | PRN

## 2020-06-28 MED ORDER — HYDROXYZINE HCL 50 MG PO TABS
50.0000 mg | ORAL_TABLET | Freq: Every evening | ORAL | Status: DC | PRN
  Filled 2020-06-28: qty 1

## 2020-06-28 MED ORDER — ONDANSETRON HCL 4 MG/2ML IJ SOLN: 4.0000 mg | Freq: Four times a day (QID) | INTRAMUSCULAR | Status: DC | PRN

## 2020-06-28 MED ORDER — THIAMINE HCL 100 MG PO TABS
100.0000 mg | ORAL_TABLET | Freq: Every day | ORAL | Status: DC
  Administered 2020-06-28 – 2020-06-29 (×2): 100 mg via ORAL
  Filled 2020-06-28 (×3): qty 1

## 2020-06-28 MED ORDER — IOHEXOL 350 MG/ML SOLN
75.0000 mL | Freq: Once | INTRAVENOUS | Status: AC | PRN
  Administered 2020-06-28: 20:00:00 75 mL via INTRAVENOUS

## 2020-06-28 MED ORDER — FOLIC ACID 1 MG PO TABS
1.0000 mg | ORAL_TABLET | Freq: Every day | ORAL | Status: DC
  Administered 2020-06-28 – 2020-06-29 (×2): 1 mg via ORAL
  Filled 2020-06-28 (×2): qty 1

## 2020-06-28 MED ORDER — ADULT MULTIVITAMIN W/MINERALS CH
1.0000 | ORAL_TABLET | Freq: Every day | ORAL | Status: DC
  Administered 2020-06-28 – 2020-06-29 (×2): 1 via ORAL
  Filled 2020-06-28 (×2): qty 1

## 2020-06-28 MED ORDER — THIAMINE HCL 100 MG/ML IJ SOLN: 100.0000 mg | Freq: Every day | INTRAMUSCULAR | Status: DC

## 2020-06-28 MED ORDER — LORAZEPAM 1 MG PO TABS
1.0000 mg | ORAL_TABLET | ORAL | Status: DC | PRN
Start: 2020-06-28 — End: 2020-06-29

## 2020-06-28 MED ORDER — GABAPENTIN 100 MG PO CAPS
200.0000 mg | ORAL_CAPSULE | Freq: Every day | ORAL | Status: DC
  Administered 2020-06-28: 200 mg via ORAL
  Filled 2020-06-28: qty 2

## 2020-06-28 MED ORDER — ONDANSETRON HCL 4 MG PO TABS: 4.0000 mg | ORAL_TABLET | Freq: Four times a day (QID) | ORAL | Status: DC | PRN

## 2020-06-28 MED ORDER — ACETAMINOPHEN 325 MG PO TABS: 325.0000 mg | ORAL_TABLET | Freq: Four times a day (QID) | ORAL | Status: DC | PRN

## 2020-06-28 MED ORDER — SODIUM CHLORIDE 0.9 % IV SOLN
80.0000 mg | Freq: Once | INTRAVENOUS | Status: AC
  Administered 2020-06-28: 11:00:00 80 mg via INTRAVENOUS
  Filled 2020-06-28: qty 80

## 2020-06-28 NOTE — ED Notes (Addendum)
Pt states that awoke at 0630 today with numbness and tingling to whole L side of body.  Sensation was normal last night when went to sleep. NIH performed. EDP informed of findings. Girlfriend at bedside. Pt states woke up at 0200 to urinate and did not have numbness at all. Pt says that L face, L arm, L leg feel different to touch than R side.  Pt states vomited about 8 ounces dark red blood this morning when woke up, after having nosebleed. Labs have been sent.

## 2020-06-28 NOTE — Progress Notes (Signed)
When patient arrived on unit, the patients girlfriend asked for a chaplain but during the admission when I asked him he said he didn't need one. The chaplain was paged and spoke with the girlfriend and the girlfriend wanted an advanced directive and she wanted a document signed. The chaplain did not do either thing. The girlfriend got very upset when she was told she could not spend the night here at the hospital and she said she wanted the patient moved to another hospital so she could stay the night with him. The girlfriend is also refusing to wear a mask and she has been asked to put one on and she said no she was not wearing one. The girlfriend wrote some sort of paper about how she can make decisions for the patient and gave it to me and I gave it to my Chiropodist. I spoke to MD and she said that she is will not transfer patient if patient is does not want to leave. Patient is A/O x4. Security was called to come up to unit so nursing supervisor and I could go in room and tell the girlfriend she could not stay the night because patient is not confused and he is not dying.

## 2020-06-28 NOTE — ED Notes (Signed)
Pt taken via ED tech

## 2020-06-28 NOTE — ED Notes (Signed)
ED Provider at bedside. 

## 2020-06-28 NOTE — ED Notes (Addendum)
EDP at bedside examining pt. Pt states that sensation is different on L face, L arm, L leg as compared to R side.

## 2020-06-28 NOTE — H&P (Addendum)
History and Physical   Sean Copeland OFB:[id number] DOB: 22-May-1970 DOA: 06/28/2020  [facility]  [facility]   I have personally briefly reviewed patient's old medical records in [facility] EMR.  Chief Concern: numbness of th left side of his body and vomiting blood   HPI: Sean Copeland is a 50 y.o. male with medical history significant for psoriasis currently on stelara and prevously on methotrexate, alcoholic liver cirrhosis, alcohol use, tobacco abuse, history of DTs, history of abdominal ascites requiring paracentesis, presented to the emergency department for chief concerns of numbness to the left side of his body.  He reports that he woke up at 6 AM and he experienced the symptoms and they are still persistent.  He reports that he has never experienced the symptoms before.  He states that he had woken up at 2 AM and did not have the symptoms.  He further endorses vomiting about 1 coffee cup of blood and nosebleed upon waking up at 6 AM.  He does not take a blood thinner. He reports dark tarry stool x 2 days. He does not take iron tablets.   He takes Stelara 45 mg injection every 3 months (last injection was about two months ago), hydroxyzine 50 mg qhs prn for sleep, sildenafil 100 mg for erectile dysfunction, gabapentin 200 mg nightly for back pain.  Note: He is prescribed spironolactone 25 mg, lactulose 30 gm daily, furosemide 40 mg every morning and he self discontinued these medications.  He takes [place] powders infrequently and the last time he used it was about three weeks.  He consumes alcohol 3-4 times a week and the last time he had a drink was evening of 06/27/2020, 6 regular size beers.He endorses a history of DTs.  Social history: lives with girlfriend. He drinks etoh 3-4x per week. He vapes nicotine.  He reports occasionally doing CBD's.  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  Vaccinations: covid 07/1919, 09/05/19, 05/25/20  ROS: Constitutional: no weight change, no fever ENT/Mouth: no sore throat, no rhinorrhea Eyes: no eye pain, no vision changes Cardiovascular: no chest pain, no dyspnea,  no edema, no palpitations Respiratory: no cough, no sputum, no wheezing Gastrointestinal: no nausea, no vomiting, no diarrhea, no constipation Genitourinary: no urinary incontinence, no dysuria, no hematuria Musculoskeletal: no arthralgias, no myalgias Skin: no skin lesions, no pruritus, Neuro: + weakness, no loss of consciousness, no syncope Psych: no anxiety, no depression, + decrease appetite Heme/Lymph: no bruising, no bleeding  ED Course: Discussed with ED provider, patient require hospitalization due to hematemesis and stroke/TIA-like symptoms.  Vitals in the ED was afebrile with temperature of 98.2, respiration rate of 14, heart rate of 110, blood pressure 130/101, satting at 97% on room air.  ED provider ordered a CTA of the head without contrast that was read as no acute intracranial pathology.  MRI of the brain without contrast was ordered and was read as no evidence of recent infarction, hemorrhage, or mass.  Assessment/Plan  Principal Problem:   Hematemesis Active Problems:   TIA (transient ischemic attack)   Epistaxis   H/O cirrhosis   # Hematemesis-multifactorial including secondary to epistasis however given history of cirrhosis and self discontinuation of cirrhotic medications, upper GI bleeding specifically esophageal varices cannot be excluded -Hemoglobin in the ED was 13.6, baseline unknown -GI, Dr. Allegra Lai has been consulted and appreciate further recommendations -Ultrasound of the liver Doppler per GI  #  Alcoholic liver cirrhosis-patient with PCP prescribed spironolactone 25 mg daily, lactulose 30 gm daily, furosemide 40 mg daily -He self stopped taking these medications many months ago   # TIA/stroke symptoms # Stroke-like  symptoms - CT head without contrast read as: No acute intracranial abnormality - UDS, fasting lipid panel, hemoglobin A1c - Neurology has been consulted and we appreciate further recommendations - Per neurology, recommend repeat MRI of the brain in the a.m., with limited sequence - CTA of the Head and neck - No antiplatelet given hematemesis - No statin given elevated LFTs - Frequent neuro vascular checks - N.p.o. pending swallow study - PT, OT - Fall precaution   # Chronic low back pain-resumed home gabapentin 200 mg nightly # Insomnia-resumed hydroxyzine 50 mg as needed for insomnia and anxiety # Psoriasis-outpatient follow-up for Stelara injection # Alcohol abuse and history of DTs -CIWA protocol initiated  Chart reviewed.   DVT prophylaxis: ted hose, holding pharmacologic DVT prophylaxis due to hematemesis and possible variceal bleeding Code Status: Full code Diet: N.p.o. initially and cleared from GI standpoint. Heart healthy diet ordered Family Communication: Updated girlfriend at bedside Disposition Plan: Pending clinical course, GI and neurology recommendation Consults called: Gastroenterology and neurology Admission status: MedSurg observation with telemetry  Past Medical History:  Diagnosis Date  . Hypertension   . Psoriasis    Social History:  has no history on file for tobacco use, alcohol use, and drug use.  Allergies  Allergen Reactions  . Codeine    No family history on file. Family history: Family history reviewed and not pertinent  Prior to Admission medications   Not on File   Physical Exam: Vitals:   06/28/20 0900 06/28/20 1000 06/28/20 1030 06/28/20 1200  BP: 131/83 123/84 (!) 146/92 125/78  Pulse: 98 (!) 107 (!) 110 (!) 109  Resp: (!) 23 (!) 21 14 17   Temp:      TempSrc:      SpO2: 96% 94% 98% 98%  Weight:      Height:       Constitutional: appears older than chronological age, NAD, calm, comfortable Eyes: PERRL, lids and conjunctivae  normal ENMT: Mucous membranes are moist. Posterior pharynx clear of any exudate or lesions. Age-appropriate dentition. Hearing appropriate Neck: normal, supple, no masses, no thyromegaly Respiratory: clear to auscultation bilaterally, no wheezing, no crackles. Normal respiratory effort. No accessory muscle use.  Cardiovascular: Regular rate and rhythm, no murmurs / rubs / gallops. No extremity edema. 2+ pedal pulses. No carotid bruits.  Abdomen: Obese abdomen, no tenderness, no masses palpated, no hepatosplenomegaly. Bowel sounds positive.  Musculoskeletal: no clubbing / cyanosis. No joint deformity upper and lower extremities. Good ROM, no contractures, no atrophy. Normal muscle tone.  Skin: no rashes, lesions, ulcers. No induration.  Multiple chronic skin tattoos in the upper extremities, crown of head, and bilateral eyebrows Neurologic: Sensation intact. Strength 5/5 in all right upper and lower extremity. Limited weakness in left upper and lower extremities. Psychiatric: Normal judgment and insight. Alert and oriented x 3. Normal mood.   EKG: independently reviewed, showing sinus rhythm with rate of 99, right bundle branch block, QTC 487  Chest x-ray on Admission: I personally reviewed and I agree with radiologist reading as below.  CT Head Wo Contrast  Result Date: 06/28/2020 CLINICAL DATA:  Left-sided numbness and tingling since this a.m. EXAM: CT HEAD WITHOUT CONTRAST TECHNIQUE: Contiguous axial images were obtained from the base of the skull through the vertex without intravenous contrast. COMPARISON:  None. FINDINGS:  Brain: No evidence of acute infarction, hemorrhage, hydrocephalus, extra-axial collection or mass lesion/mass effect. Vascular: No hyperdense vessel or unexpected calcification. Skull: Normal. Negative for fracture or focal lesion. Sinuses/Orbits: No acute finding. Other: None. IMPRESSION: No acute intracranial pathology. Electronically Signed   By: Maudry Mayhew MD   On:  06/28/2020 10:02   MR BRAIN WO CONTRAST  Result Date: 06/28/2020 CLINICAL DATA:  Left-sided numbness EXAM: MRI HEAD WITHOUT CONTRAST TECHNIQUE: Multiplanar, multiecho pulse sequences of the brain and surrounding structures were obtained without intravenous contrast. COMPARISON:  None. FINDINGS: Brain: There is no acute infarction or intracranial hemorrhage. There is no intracranial mass, mass effect, or edema. There is no hydrocephalus or extra-axial fluid collection. Ventricles and sulci are normal in size and configuration. Vascular: Major vessel flow voids at the skull base are preserved. Skull and upper cervical spine: Normal marrow signal is preserved. Sinuses/Orbits: Minor mucosal thickening.  Orbits are unremarkable. Other: Sella is unremarkable.  Mastoid air cells are clear. IMPRESSION: No evidence of recent infarction, hemorrhage, or mass. Electronically Signed   By: Guadlupe Spanish M.D.   On: 06/28/2020 11:53   Labs on Admission: I have personally reviewed following labs  CBC: Recent Labs  Lab 06/28/20 0851  WBC 8.3  NEUTROABS 6.0  HGB 13.6  HCT 42.3  MCV 91.6  PLT 209   Basic Metabolic Panel: Recent Labs  Lab 06/28/20 0851  NA 141  K 3.6  CL 102  CO2 29  GLUCOSE 111*  BUN 9  CREATININE 0.98  CALCIUM 8.7*   GFR: Estimated Creatinine Clearance: 89.7 mL/min (by C-G formula based on SCr of 0.98 mg/dL).  Liver Function Tests: Recent Labs  Lab 06/28/20 0851  AST 77*  ALT 48*  ALKPHOS 89  BILITOT 1.1  PROT 8.1  ALBUMIN 3.9   Coagulation Profile: Recent Labs  Lab 06/28/20 0851  INR 1.2   Lyndon [place] N Naiomi Musto D.O. [facility]  If 7PM-7AM, please contact overnight-coverage provider If 7AM-7PM, please contact day coverage provider [email or web address]  06/28/2020, 1:31 PM

## 2020-06-28 NOTE — ED Notes (Signed)
Pt to MRI

## 2020-06-28 NOTE — ED Notes (Signed)
ED Tech will take pt to floor

## 2020-06-28 NOTE — ED Notes (Signed)
Called lab. They have received all appropriate blood tubes and will run ordered labs.

## 2020-06-28 NOTE — ED Notes (Signed)
LKW determined by EDP to be 0200 today.

## 2020-06-28 NOTE — Consult Note (Signed)
Cephas Darby, MD [address]  [place], [place] [address]  Main: [phone]  Fax: [phone] Pager: [phone]   Consultation  Referring Provider:     No ref. provider found Primary Care Physician:  [facility] Primary Gastroenterologist: [facility]         Reason for Consultation:     Hematemesis, melena  Date of Admission:  06/28/2020 Date of Consultation:  06/28/2020         HPI:   Sean Copeland is a 50 y.o. male history of alcohol use, tobacco use, known history of cirrhosis secondary to alcohol use presents with weakness, numbness in left lower extremity as well as 2 days of black tarry stool, 1 day of hematemesis.  Patient reports that he threw up a small cupful of blood in the morning.  He also noticed bleeding from his nose that was self-limited.  His last alcohol drink was yesterday.  He denies any abdominal pain, swelling of legs.  He denies similar episodes in the past.  He never had an upper endoscopy.  He denies any abdominal distention or paracentesis.  Patient does not take any medications for his cirrhosis as outpatient Patient also reports that he cirrhosis was also attributed to methotrexate use for psoriasis.  He stopped methotrexate  For his asymmetric lower extremity weakness, patient underwent CT head, MRI brain which were unremarkable.  Patient is evaluated by stroke team and undergoing further work-up.  Patient is started on pantoprazole drip, kept n.p.o. and consulted GI for further evaluation.  I recommended the ER physician to perform ultrasound liver Dopplers.  Labs on presentation revealed normal hemoglobin, platelets, PT/INR, BMP.  AST and ALT were mildly elevated.   NSAIDs: None  Antiplts/Anticoagulants/Anti thrombotics: None  GI Procedures: None  Past Medical History:  Diagnosis Date  . Hypertension   . Psoriasis     History reviewed. No pertinent surgical history.   Current Facility-Administered  Medications:  .  acetaminophen (TYLENOL) tablet 325 mg, 325 mg, Oral, Q6H PRN **OR** acetaminophen (TYLENOL) suppository 325 mg, 325 mg, Rectal, Q6H PRN, Cox, Amy N, DO .  folic acid (FOLVITE) tablet 1 mg, 1 mg, Oral, Daily, Cox, Amy N, DO, 1 mg at 06/28/20 1637 .  gabapentin (NEURONTIN) capsule 200 mg, 200 mg, Oral, QHS, Cox, Amy N, DO .  hydrOXYzine (ATARAX/VISTARIL) tablet 50 mg, 50 mg, Oral, QHS PRN, Cox, Amy N, DO .  LORazepam (ATIVAN) tablet 1-4 mg, 1-4 mg, Oral, Q1H PRN **OR** LORazepam (ATIVAN) injection 1-4 mg, 1-4 mg, Intravenous, Q1H PRN, Cox, Amy N, DO .  multivitamin with minerals tablet 1 tablet, 1 tablet, Oral, Daily, Cox, Amy N, DO, 1 tablet at 06/28/20 1637 .  ondansetron (ZOFRAN) tablet 4 mg, 4 mg, Oral, Q6H PRN **OR** ondansetron (ZOFRAN) injection 4 mg, 4 mg, Intravenous, Q6H PRN, Cox, Amy N, DO .  thiamine tablet 100 mg, 100 mg, Oral, Daily, 100 mg at 06/28/20 1637 **OR** thiamine (B-1) injection 100 mg, 100 mg, Intravenous, Daily, Cox, Amy N, DO   History reviewed. No pertinent family history.   Social History   Tobacco Use  . Smoking status: Former Smoker    Packs/day: 0.50    Years: 35.00    Pack years: 17.50    Types: Cigarettes    Quit date: 02/20/2020    Years since quitting: 0.3  . Smokeless tobacco: Never Used  Vaping Use  . Vaping Use: Every day  . Start date: 02/20/2020  .  Substances: Nicotine, CBD  Substance Use Topics  . Alcohol use: Yes    Alcohol/week: 6.0 standard drinks    Types: 6 Cans of beer per week    Comment: 3-4 times a week  . Drug use: Never    Allergies as of 06/28/2020 - Review Complete 06/28/2020  Allergen Reaction Noted  . Codeine  06/28/2020    Review of Systems:    All systems reviewed and negative except where noted in HPI.   Physical Exam:  Vital signs in last 24 hours: Temp:  [98.2 F (36.8 C)-99.2 F (37.3 C)] 99.2 F (37.3 C) (03/10 1518) Pulse Rate:  [98-115] 104 (03/10 1518) Resp:  [14-23] 20 (03/10 1518) BP:  (123-146)/(69-92) 124/69 (03/10 1518) SpO2:  [94 %-98 %] 98 % (03/10 1518) Weight:  [70.3 kg] 70.3 kg (03/10 0848)   General:   Pleasant, cooperative in NAD Head:  Normocephalic and atraumatic. Eyes:   No icterus.   Conjunctiva pink. PERRLA. Ears:  Normal auditory acuity. Neck:  Supple; no masses or thyroidomegaly Lungs: Respirations even and unlabored. Lungs clear to auscultation bilaterally.   No wheezes, crackles, or rhonchi.  Heart:  Regular rate and rhythm;  Without murmur, clicks, rubs or gallops Abdomen:  Soft, nondistended, nontender. Normal bowel sounds. No appreciable masses or hepatomegaly.  No rebound or guarding.  Rectal:  Not performed. Msk:  Symmetrical without gross deformities.  Strength normal Extremities:  Without edema, cyanosis or clubbing. Neurologic:  Alert and oriented x3;  grossly normal neurologically. Skin:  Intact without significant lesions or rashes. Psych:  Alert and cooperative. Normal affect.  LAB RESULTS: CBC Latest Ref Rng & Units 06/28/2020 06/28/2020  WBC 4.0 - 10.5 K/uL 9.5 8.3  Hemoglobin 13.0 - 17.0 g/dL 12.6(L) 13.6  Hematocrit 39.0 - 52.0 % 38.1(L) 42.3  Platelets 150 - 400 K/uL 200 209    BMET BMP Latest Ref Rng & Units 06/28/2020 06/28/2020  Glucose 70 - 99 mg/dL 94 111(H)  BUN 6 - 20 mg/dL 8 9  Creatinine 0.61 - 1.24 mg/dL 0.83 0.98  Sodium 135 - 145 mmol/L 139 141  Potassium 3.5 - 5.1 mmol/L 3.3(L) 3.6  Chloride 98 - 111 mmol/L 103 102  CO2 22 - 32 mmol/L 25 29  Calcium 8.9 - 10.3 mg/dL 8.6(L) 8.7(L)    LFT Hepatic Function Latest Ref Rng & Units 06/28/2020 06/28/2020  Total Protein 6.5 - 8.1 g/dL 7.6 8.1  Albumin 3.5 - 5.0 g/dL 3.8 3.9  AST 15 - 41 U/L 70(H) 77(H)  ALT 0 - 44 U/L 47(H) 48(H)  Alk Phosphatase 38 - 126 U/L 80 89  Total Bilirubin 0.3 - 1.2 mg/dL 1.4(H) 1.1     STUDIES: CT Head Wo Contrast  Result Date: 06/28/2020 CLINICAL DATA:  Left-sided numbness and tingling since this a.m. EXAM: CT HEAD WITHOUT CONTRAST  TECHNIQUE: Contiguous axial images were obtained from the base of the skull through the vertex without intravenous contrast. COMPARISON:  None. FINDINGS: Brain: No evidence of acute infarction, hemorrhage, hydrocephalus, extra-axial collection or mass lesion/mass effect. Vascular: No hyperdense vessel or unexpected calcification. Skull: Normal. Negative for fracture or focal lesion. Sinuses/Orbits: No acute finding. Other: None. IMPRESSION: No acute intracranial pathology. Electronically Signed   By: Dahlia Bailiff MD   On: 06/28/2020 10:02   MR BRAIN WO CONTRAST  Result Date: 06/28/2020 CLINICAL DATA:  Left-sided numbness EXAM: MRI HEAD WITHOUT CONTRAST TECHNIQUE: Multiplanar, multiecho pulse sequences of the brain and surrounding structures were obtained without intravenous contrast. COMPARISON:  None. FINDINGS: Brain: There is no acute infarction or intracranial hemorrhage. There is no intracranial mass, mass effect, or edema. There is no hydrocephalus or extra-axial fluid collection. Ventricles and sulci are normal in size and configuration. Vascular: Major vessel flow voids at the skull base are preserved. Skull and upper cervical spine: Normal marrow signal is preserved. Sinuses/Orbits: Minor mucosal thickening.  Orbits are unremarkable. Other: Sella is unremarkable.  Mastoid air cells are clear. IMPRESSION: No evidence of recent infarction, hemorrhage, or mass. Electronically Signed   By: Macy Mis M.D.   On: 06/28/2020 11:53   US LIVER DOPPLER  Result Date: 06/28/2020 CLINICAL DATA:  Abdominal pain, hemoptysis, history of GI bleed EXAM: DUPLEX ULTRASOUND OF LIVER TECHNIQUE: Color and duplex Doppler ultrasound was performed to evaluate the hepatic in-flow and out-flow vessels. COMPARISON:  None. FINDINGS: Liver: Increased echogenicity surface nodularity suggesting hepatic steatosis. No intrahepatic biliary dilatation. No large focal mass or intrahepatic biliary dilatation Main Portal Vein size:  1.7 cm Portal Vein Velocities Main Prox:  33 cm/sec Main Mid: 34 cm/sec Main Dist:  31 cm/sec Right: 24 cm/sec Left: 416 cm/sec Hepatic Vein Velocities Right:  27 cm/sec Middle:  24 cm/sec Left:  36 cm/sec IVC: Present and patent with normal respiratory phasicity. Hepatic Artery Velocity:  130 cm/sec Splenic Vein Velocity:  22 cm/sec Spleen: 7.9 cm x 11.7 cm x 5.5 cm with a total volume of 267 cm^3 (411 cm^3 is upper limit normal) Portal Vein Occlusion/Thrombus: No Splenic Vein Occlusion/Thrombus: No Ascites: None Varices: None Gallbladder is nondistended but appears to contain small gallstones. Patent portal, hepatic and splenic veins with normal directional flow. Portal vein is dilated measuring 1.7 cm in diameter suggesting component of portal hypertension. No portal vein occlusion or thrombus. IMPRESSION: Findings suggesting hepatic cirrhosis. Dilated portal vein suggesting component of portal hypertension but with preserved hepatopetal flow. Electronically Signed   By: Jerilynn Mages.  Shick M.D.   On: 06/28/2020 16:08      Impression / Plan:   Sean Copeland is a 50 y.o. male with history of alcoholic cirrhosis, well compensated, presented with left lower extremity weakness, difficulty walking, numbness as well as 1 episode of hematemesis and 2 days of black tarry stools  Hematemesis and melena, with history of compensated cirrhosis Ongoing alcohol use Based on his laboratory parameters, very less likely variceal bleed Agree with pantoprazole drip Monitor CBC closely and maintain hemoglobin above 7, platelets above 50 Ultrasound liver Dopplers revealed portal hypertension, preserved hepatopetal flow and cirrhosis with no evidence of splenomegaly, with no evidence of portal vein thrombosis Patient is currently undergoing work-up of stroke, therefore will defer endoscopic evaluation until then unless he develops active GI bleed Okay to resume diet from GI standpoint Discussed my recommendations with patient and  he is agreeable  Thank you for involving me in the care of this patient.  GI will follow along with you    LOS: 0 days   Sherri Sear, MD  06/28/2020, 5:35 PM   Note: This dictation was prepared with Dragon dictation along with smaller phrase technology. Any transcriptional errors that result from this process are unintentional.

## 2020-06-28 NOTE — ED Notes (Signed)
Pt back from CT

## 2020-06-28 NOTE — ED Notes (Signed)
MD/DO  at bedside.

## 2020-06-28 NOTE — ED Notes (Signed)
Ultrasound finished 

## 2020-06-28 NOTE — Consult Note (Signed)
Stroke Neurology Consultation Note  Consult Requested by: Dr. Erma Heritage  Reason for Consult: left sided numbness  Consult Date: 06/28/20   The history was obtained from the pt.  During history and examination, all items were able to obtain unless otherwise noted.  History of Present Illness:  Sean Copeland is a 50 y.o. Caucasian male with PMH of alcoholic cirrhosis, psoriasis, HTN, and left knee s/p replacement presented to ED for left sided numbness and nose bleeding with vomiting blood.   Per pt, he got up at 2am for bathroom without problem. 7am when he woke up he had left face arm and leg numbness, feeling left side whole body in sleep, also felt some heaviness of left arm and leg, stumbling on walking. He also felt nausea and had vomiting with blood, but at the same time he also had nose bleeding, not sure if he swallowed some blood. He denies any HA, vision or speech changes. He came to ER for evaluation.   In ER, BP 133/84, glucose 94, INR 1.2, platelet normal. AST/ALT 70/47. Had CT and MRI did not show any acute changes. However, he continues to complaining of left sided tingling and hypersensitivity on palpation.   LSN: 2am tPA Given: No: outside window, MRI neg  Past Medical History:  Diagnosis Date  . Hypertension   . Psoriasis     No family history on file.  Social History:  has no history on file for tobacco use, alcohol use, and drug use.  Allergies:  Allergies  Allergen Reactions  . Codeine     No current facility-administered medications on file prior to encounter.   No current outpatient medications on file prior to encounter.    Review of Systems: A full ROS was attempted today and was able to be performed.  Systems assessed include - Constitutional, Eyes, HENT, Respiratory, Cardiovascular, Gastrointestinal, Genitourinary, Integument/breast, Hematologic/lymphatic, Musculoskeletal, Neurological, Behavioral/Psych, Endocrine, Allergic/Immunologic - with pertinent  responses as per HPI.  Physical Examination: Temp:  [98.2 F (36.8 C)] 98.2 F (36.8 C) (03/10 0852) Pulse Rate:  [98-115] 115 (03/10 1413) Resp:  [14-23] 17 (03/10 1200) BP: (123-146)/(77-92) 134/77 (03/10 1413) SpO2:  [94 %-98 %] 98 % (03/10 1200) Weight:  [70.3 kg] 70.3 kg (03/10 0848)  General - well nourished, well developed, in no apparent distress.    Ophthalmologic - fundi not visualized due to noncooperation.    Cardiovascular - regular rhythm and rate  Mental Status -  Level of arousal and orientation to time, place, and person were intact. Language including expression, naming, repetition, comprehension, reading, and writing was assessed and found intact. Attention span and concentration were normal. Fund of Knowledge was assessed and was intact.  Cranial Nerves II - XII - II - Vision intact OU. III, IV, VI - Extraocular movements intact. V - Facial sensation decreased on the left. VII - Facial movement intact bilaterally. VIII - Hearing & vestibular intact bilaterally. X - Palate elevates symmetrically. XI - Chin turning & shoulder shrug intact bilaterally. XII - Tongue protrusion intact.  Motor Strength - The patient's strength was normal in right UE and LE, however, mild drift on the left, however, no drift with distraction. Left LE mild give away weakness and with possible hoover's sign.    Motor Tone & Bulk - Muscle tone was assessed at the neck and appendages and was normal.  Bulk was normal and fasciculations were absent.   Reflexes - The patient's reflexes were 2+ b/l patellar reflexes, otherwise diminished. He had  no pathological reflexes.  Sensory - Light touch, temperature/pinprick were assessed and were decreased on the left UE and LE    Coordination - The patient had normal movements in the hands with no ataxia or dysmetria.  Tremor was absent.  Gait and Station - walked him in the room, he kept his left knee extended on walking. Denies left knee  pain.   NIH Stroke Scale  Level Of Consciousness 0=Alert; keenly responsive 1=Arouse to minor stimulation 2=Requires repeated stimulation to arouse or movements to pain 3=postures or unresponsive 0  LOC Questions to Month and Age 46=Answers both questions correctly 1=Answers one question correctly or dysarthria/intubated/trauma/language barrier 2=Answers neither question correctly or aphasia 0  LOC Commands      -Open/Close eyes     -Open/close grip     -Pantomime commands if communication barrier 0=Performs both tasks correctly 1=Performs one task correctly 2=Performs neighter task correctly 0  Best Gaze     -Only assess horizontal gaze 0=Normal 1=Partial gaze palsy 2=Forced deviation, or total gaze paresis 0  Visual 0=No visual loss 1=Partial hemianopia 2=Complete hemianopia 3=Bilateral hemianopia (blind including cortical blindness) 0  Facial Palsy     -Use grimace if obtunded 0=Normal symmetrical movement 1=Minor paralysis (asymmetry) 2=Partial paralysis (lower face) 3=Complete paralysis (upper and lower face) 0  Motor  0=No drift for 10/5 seconds 1=Drift, but does not hit bed 2=Some antigravity effort, hits  bed 3=No effort against gravity, limb falls 4=No movement 0=Amputation/joint fusion Right Arm 0     Leg 0    Left Arm 0     Leg 1  Limb Ataxia     - FNT/HTS 0=Absent or does not understand or paralyzed or amputation/joint fusion 1=Present in one limb 2=Present in two limbs 0  Sensory 0=Normal 1=Mild to moderate sensory loss 2=Severe to total sensory loss or coma/unresponsive 1  Best Language 0=No aphasia, normal 1=Mild to moderate aphasia 2=Severe aphasia 3=Mute, global aphasia, or coma/unresponsive 0  Dysarthria 0=Normal 1=Mild to moderate 2=Severe, unintelligible or mute/anarthric 0=intubated/unable to test 0  Extinction/Neglect 0=No abnormality 1=visual/tactile/auditory/spatia/personal inattention/Extinction to bilateral simultaneous  stimulation 2=Profound neglect/extinction more than 1 modality  0  Total   2     Data Reviewed: CT Head Wo Contrast  Result Date: 06/28/2020 CLINICAL DATA:  Left-sided numbness and tingling since this a.m. EXAM: CT HEAD WITHOUT CONTRAST TECHNIQUE: Contiguous axial images were obtained from the base of the skull through the vertex without intravenous contrast. COMPARISON:  None. FINDINGS: Brain: No evidence of acute infarction, hemorrhage, hydrocephalus, extra-axial collection or mass lesion/mass effect. Vascular: No hyperdense vessel or unexpected calcification. Skull: Normal. Negative for fracture or focal lesion. Sinuses/Orbits: No acute finding. Other: None. IMPRESSION: No acute intracranial pathology. Electronically Signed   By: Maudry Mayhew MD   On: 06/28/2020 10:02   MR BRAIN WO CONTRAST  Result Date: 06/28/2020 CLINICAL DATA:  Left-sided numbness EXAM: MRI HEAD WITHOUT CONTRAST TECHNIQUE: Multiplanar, multiecho pulse sequences of the brain and surrounding structures were obtained without intravenous contrast. COMPARISON:  None. FINDINGS: Brain: There is no acute infarction or intracranial hemorrhage. There is no intracranial mass, mass effect, or edema. There is no hydrocephalus or extra-axial fluid collection. Ventricles and sulci are normal in size and configuration. Vascular: Major vessel flow voids at the skull base are preserved. Skull and upper cervical spine: Normal marrow signal is preserved. Sinuses/Orbits: Minor mucosal thickening.  Orbits are unremarkable. Other: Sella is unremarkable.  Mastoid air cells are clear. IMPRESSION: No evidence of recent infarction, hemorrhage,  or mass. Electronically Signed   By: Guadlupe Spanish M.D.   On: 06/28/2020 11:53    Assessment: 50 y.o. male with PMH of alcoholic cirrhosis, psoriasis, HTN presented to ED for left sided numbness and nose bleeding with vomiting blood. LSW 2am when he was at baseline, but woke up at 7am left sided numbness  tingling. Denies any HA, vision or speech changes. NIHSS = 2 for sensation and left leg drift (give away). CT and MRI brain no acute finding. Pt not tPA candidate given outside window and no stroke on MRI. Pt motor exam seems to have lack of effort, but sensory exam seems real. Pt does have a lot of stress lately, DDx include DWI-negative stroke, migraine equivalent, or conversion disorder. Will repeat limited MRI brain in am, CTA head and neck, and let him work with PT/OT. Will not start antiplatelet due to nose bleeding and hematoemesis, and no statin given liver disease with elevated LFTs.   Plan: - Recommend admission for further work up for multiple medical conditions - Frequent neuro checks - Telemetry monitoring - MRI brain repeat in am with limited sequence - CTA head and neck  - UDS, fasting lipid panel and HgbA1C - PT/OT consult - No antiplatelet for now given bleeding - No statin for now given elevated LFTs - Discussed with Dr. Erma Heritage EDP - Will follow   Thank you for this consultation and allowing [place] to participate in the care of this patient.  Marvel Plan, MD PhD Stroke Neurology 06/28/2020 3:33 PM

## 2020-06-28 NOTE — ED Notes (Signed)
Pt asking if can eat. Sent message to EDP to confirm whether pt is NPO due to question of GI bleeding or if can eat because passed stroke swallow screen.

## 2020-06-28 NOTE — ED Triage Notes (Signed)
Pt from home via ems, states that he woke up this am with a numbness/tingling to the entire left side of his body. Pt then states that he began to vomit blood and have a nose bleed. Pt reports hx of gi bleed with methotrexate treatment for psoriasis but not taking that med any more. Pt denies pain.

## 2020-06-28 NOTE — ED Provider Notes (Signed)
[facility] Emergency Department Provider Note  ____________________________________________   Event Date/Time   First MD Initiated Contact with Patient 06/28/20 [id number]     (approximate)  I have reviewed the triage vital signs and the nursing notes.   HISTORY  Chief Complaint Vomiting, Epistaxis, and Numbness    HPI Sean Copeland is a 50 y.o. male here with left-sided numbness and possible vomiting of blood.  Regarding his main complaint, the patient states that he woke up at around 630 this morning with left arm, leg, and face numbness.  He states it feels like it is asleep.  States has had associated weakness and had difficulty walking with his left leg as well as grabbing things with his left arm.  This is all new.  He woke up at 2 AM and felt normal.  Denies any accompanying visual symptoms, aphasia, or neglect.  Denies any headache.  No neck pain.  No recent falls.  Patient also reports that he woke up this morning with a small, controlled nosebleed.  He then vomited what he describes as a significant amount of bright red blood with clots in it.  This is new for him.  He does have history of cirrhosis for which she follows with the [place].  Denies known history of varices.  Denies any melena        Past Medical History:  Diagnosis Date  . Hypertension   . Psoriasis     There are no problems to display for this patient.    Prior to Admission medications   Not on File    Allergies Codeine  No family history on file.  Social History    Review of Systems  Review of Systems  Constitutional: Positive for fatigue. Negative for chills and fever.  HENT: Negative for sore throat.   Respiratory: Negative for shortness of breath.   Cardiovascular: Negative for chest pain.  Gastrointestinal: Positive for nausea and vomiting. Negative for abdominal pain.  Genitourinary: Negative for flank pain.  Musculoskeletal: Negative for neck pain.  Skin: Negative  for rash and wound.  Allergic/Immunologic: Negative for immunocompromised state.  Neurological: Positive for weakness and numbness.  Hematological: Does not bruise/bleed easily.  All other systems reviewed and are negative.    ____________________________________________  PHYSICAL EXAM:      VITAL SIGNS: ED Triage Vitals  Enc Vitals Group     BP 06/28/20 0848 133/84     Pulse Rate 06/28/20 0848 (!) 103     Resp 06/28/20 0848 18     Temp 06/28/20 0852 98.2 F (36.8 C)     Temp Source 06/28/20 0852 Oral     SpO2 06/28/20 0845 97 %     Weight 06/28/20 0848 155 lb (70.3 kg)     Height 06/28/20 0848 5\' 10"  (1.778 m)     Head Circumference --      Peak Flow --      Pain Score 06/28/20 0848 0     Pain Loc --      Pain Edu? --      Excl. in GC? --      Physical Exam Vitals and nursing note reviewed.  Constitutional:      General: He is not in acute distress.    Appearance: He is well-developed.  HENT:     Head: Normocephalic and atraumatic.  Eyes:     Conjunctiva/sclera: Conjunctivae normal.  Cardiovascular:     Rate and Rhythm: Normal rate and regular rhythm.  Heart sounds: Normal heart sounds.  Pulmonary:     Effort: Pulmonary effort is normal. No respiratory distress.     Breath sounds: No wheezing.  Abdominal:     General: There is no distension.  Musculoskeletal:     Cervical back: Neck supple.  Skin:    General: Skin is warm.     Capillary Refill: Capillary refill takes less than 2 seconds.     Findings: No rash.  Neurological:     Mental Status: He is alert and oriented to person, place, and time.     Motor: No abnormal muscle tone.     Comments: Cranial nerves II through XII intact although subjective diminished sensation along the left V1 through V3 trigeminal nerve distributions.  Strength 5 out of 5 bilateral upper and lower extremities although pronator drift noted after 6 to 7 seconds on the left arm.  Subjective diminished sensation to pinprick  throughout the left upper and lower extremities.       ____________________________________________   LABS (all labs ordered are listed, but only abnormal results are displayed)  Labs Reviewed  COMPREHENSIVE METABOLIC PANEL - Abnormal; Notable for the following components:      Result Value   Glucose, Bld 111 (*)    Calcium 8.7 (*)    AST 77 (*)    ALT 48 (*)    All other components within normal limits  APTT - Abnormal; Notable for the following components:   aPTT 43 (*)    All other components within normal limits  RESP PANEL BY RT-PCR (FLU A&B, COVID) ARPGX2  CBC WITH DIFFERENTIAL/PLATELET  PROTIME-INR  AMMONIA  TYPE AND SCREEN  TROPONIN I (HIGH SENSITIVITY)  TROPONIN I (HIGH SENSITIVITY)    ____________________________________________  EKG: Normal sinus rhythm, ventricular rate 99.  QRS 136, QTc 47.  ST elevation, likely repull abnormality.  No STEMI. ________________________________________  RADIOLOGY All imaging, including plain films, CT scans, and ultrasounds, independently reviewed by me, and interpretations confirmed via formal radiology reads.  ED MD interpretation:   CT head: No acute abnormality MRI brain: No acute stroke  Official radiology report(s): CT Head Wo Contrast  Result Date: 06/28/2020 CLINICAL DATA:  Left-sided numbness and tingling since this a.m. EXAM: CT HEAD WITHOUT CONTRAST TECHNIQUE: Contiguous axial images were obtained from the base of the skull through the vertex without intravenous contrast. COMPARISON:  None. FINDINGS: Brain: No evidence of acute infarction, hemorrhage, hydrocephalus, extra-axial collection or mass lesion/mass effect. Vascular: No hyperdense vessel or unexpected calcification. Skull: Normal. Negative for fracture or focal lesion. Sinuses/Orbits: No acute finding. Other: None. IMPRESSION: No acute intracranial pathology. Electronically Signed   By: Maudry Mayhew MD   On: 06/28/2020 10:02   MR BRAIN WO  CONTRAST  Result Date: 06/28/2020 CLINICAL DATA:  Left-sided numbness EXAM: MRI HEAD WITHOUT CONTRAST TECHNIQUE: Multiplanar, multiecho pulse sequences of the brain and surrounding structures were obtained without intravenous contrast. COMPARISON:  None. FINDINGS: Brain: There is no acute infarction or intracranial hemorrhage. There is no intracranial mass, mass effect, or edema. There is no hydrocephalus or extra-axial fluid collection. Ventricles and sulci are normal in size and configuration. Vascular: Major vessel flow voids at the skull base are preserved. Skull and upper cervical spine: Normal marrow signal is preserved. Sinuses/Orbits: Minor mucosal thickening.  Orbits are unremarkable. Other: Sella is unremarkable.  Mastoid air cells are clear. IMPRESSION: No evidence of recent infarction, hemorrhage, or mass. Electronically Signed   By: Guadlupe Spanish M.D.   On: 06/28/2020  11:53    ____________________________________________  PROCEDURES   Procedure(s) performed (including Critical Care):  .1-3 Lead EKG Interpretation Performed by: Shaune Pollack, MD Authorized by: Shaune Pollack, MD     Interpretation: abnormal     ECG rate:  100-120   ECG rate assessment: tachycardic     Rhythm: sinus tachycardia     Ectopy: none     Conduction: normal   Comments:     Indication: Weakness, tachycardia    ____________________________________________  INITIAL IMPRESSION / MDM / ASSESSMENT AND PLAN / ED COURSE  As part of my medical decision making, I reviewed the following data within the electronic MEDICAL RECORD NUMBER Nursing notes reviewed and incorporated, Old chart reviewed, Notes from prior ED visits, and [place] Controlled Substance Database       *Kagen Kunath was evaluated in Emergency Department on 06/28/2020 for the symptoms described in the history of present illness. He was evaluated in the context of the global COVID-19 pandemic, which necessitated consideration that the patient  might be at risk for infection with the SARS-CoV-2 virus that causes COVID-19. Institutional protocols and algorithms that pertain to the evaluation of patients at risk for COVID-19 are in a state of rapid change based on information released by regulatory bodies including the CDC and federal and state organizations. These policies and algorithms were followed during the patient's care in the ED.  Some ED evaluations and interventions may be delayed as a result of limited staffing during the pandemic.*     Medical Decision Making: 50 year old male here with multiple issues.  Regarding his left-sided numbness and weakness, differential includes TIA versus CVA.  Facial involvement makes cervical etiology unlikely.  Differential also includes anxiety, less likely atypical migraine in the absence of headache.  Regarding his reported hematemesis, I suspect this could be related to a nosebleed with swallowed blood.  However, he does have a history of cirrhosis and has previously required multiple paracenteses, raising question of underlying portal hypertension.  For his numbness, CT head and MRI reviewed, showed no acute abnormality.  His electrolytes are largely within normal limits.  I discussed the case with neurology who will evaluate the patient.  Regarding his hematemesis, discussed with Dr. Allegra Lai of GI.  She recommends an ultrasound of his liver and will see the patient.  He is not on blood thinners.  No signs of ongoing bleeding here and hemoglobin is stable.  BUN/creatinine appears normal.  Given ongoing neurological symptoms as well as reported hematemesis and known cirrhotic, will admit for observation.  ____________________________________________  FINAL CLINICAL IMPRESSION(S) / ED DIAGNOSES  Final diagnoses:  Upper abdominal pain  Hematemesis, presence of nausea not specified  Left arm numbness  Left leg numbness     MEDICATIONS GIVEN DURING THIS VISIT:  Medications  pantoprazole  (PROTONIX) 80 mg in sodium chloride 0.9 % 100 mL IVPB (0 mg Intravenous Paused 06/28/20 1105)     ED Discharge Orders    None       Note:  This document was prepared using Dragon voice recognition software and may include unintentional dictation errors.   Shaune Pollack, MD 06/28/20 1249

## 2020-06-28 NOTE — Progress Notes (Signed)
   06/28/20 1604  Clinical Encounter Type  Visited With Patient and family together  Visit Type Initial  Referral From Nurse  Consult/Referral To Chaplain  Spiritual Encounters  Spiritual Needs Brochure  Chaplain Oleta Mouse was paged by Nurse Stanton Kidney for room 1C-102A, Pt Sean Copeland. The nurse stated the Pt is requesting to see the Chaplain but he is not telling me why, she asked do I need to put in an OR?" I said, "no it's ok for now, I will come right over." When I arrived to the room the girlfriend of the Pt had a piece of paper in which she had written and she asked me if I could sign it and get it notarized. I informed the girlfriend of our Policies and Procedure and advised her that I could not sign any documents. I told her the nurses who are notaries can only sign our Ad's. She asked what is that? I explained to her what an AD was and she said, "well go get me one of those." I said I can go get it but because of the time I don't believe we will be able to complete it for you but we can do it first thing in the morning." She stated, I need it done now." I left and returned to the room with the AD and I did the education part, I also emphasized several times this document will only name a person to be a health care agent and make all of the Pt's medical decisions in the event he's not able. She stated, "Ok, I need it done right now." I said ma'am the volunteers are gone for today and the notaries, so we cannot get it completed today. I informed the nurses station of our engagement conversation.

## 2020-06-28 NOTE — ED Notes (Signed)
Provided phone to pt to talk with MRI screener.

## 2020-06-29 ENCOUNTER — Observation Stay: Payer: No Typology Code available for payment source

## 2020-06-29 DIAGNOSIS — R188 Other ascites: Secondary | ICD-10-CM | POA: Diagnosis present

## 2020-06-29 DIAGNOSIS — R200 Anesthesia of skin: Secondary | ICD-10-CM

## 2020-06-29 DIAGNOSIS — K8020 Calculus of gallbladder without cholecystitis without obstruction: Secondary | ICD-10-CM | POA: Diagnosis present

## 2020-06-29 DIAGNOSIS — K920 Hematemesis: Secondary | ICD-10-CM | POA: Diagnosis present

## 2020-06-29 DIAGNOSIS — K7030 Alcoholic cirrhosis of liver without ascites: Secondary | ICD-10-CM | POA: Diagnosis present

## 2020-06-29 DIAGNOSIS — L4050 Arthropathic psoriasis, unspecified: Secondary | ICD-10-CM | POA: Diagnosis present

## 2020-06-29 DIAGNOSIS — I10 Essential (primary) hypertension: Secondary | ICD-10-CM | POA: Diagnosis present

## 2020-06-29 DIAGNOSIS — F17290 Nicotine dependence, other tobacco product, uncomplicated: Secondary | ICD-10-CM | POA: Diagnosis present

## 2020-06-29 DIAGNOSIS — K760 Fatty (change of) liver, not elsewhere classified: Secondary | ICD-10-CM | POA: Diagnosis present

## 2020-06-29 DIAGNOSIS — K766 Portal hypertension: Secondary | ICD-10-CM | POA: Diagnosis present

## 2020-06-29 DIAGNOSIS — K921 Melena: Secondary | ICD-10-CM | POA: Diagnosis present

## 2020-06-29 DIAGNOSIS — Z8719 Personal history of other diseases of the digestive system: Secondary | ICD-10-CM | POA: Diagnosis not present

## 2020-06-29 DIAGNOSIS — Z79899 Other long term (current) drug therapy: Secondary | ICD-10-CM | POA: Diagnosis not present

## 2020-06-29 DIAGNOSIS — G959 Disease of spinal cord, unspecified: Secondary | ICD-10-CM | POA: Diagnosis present

## 2020-06-29 DIAGNOSIS — F1010 Alcohol abuse, uncomplicated: Secondary | ICD-10-CM | POA: Diagnosis present

## 2020-06-29 DIAGNOSIS — R040 Epistaxis: Principal | ICD-10-CM

## 2020-06-29 DIAGNOSIS — Z9119 Patient's noncompliance with other medical treatment and regimen: Secondary | ICD-10-CM | POA: Diagnosis not present

## 2020-06-29 DIAGNOSIS — R7989 Other specified abnormal findings of blood chemistry: Secondary | ICD-10-CM | POA: Diagnosis present

## 2020-06-29 DIAGNOSIS — R531 Weakness: Secondary | ICD-10-CM

## 2020-06-29 DIAGNOSIS — Z20822 Contact with and (suspected) exposure to covid-19: Secondary | ICD-10-CM | POA: Diagnosis present

## 2020-06-29 DIAGNOSIS — G8929 Other chronic pain: Secondary | ICD-10-CM | POA: Diagnosis present

## 2020-06-29 DIAGNOSIS — L409 Psoriasis, unspecified: Secondary | ICD-10-CM | POA: Diagnosis present

## 2020-06-29 DIAGNOSIS — M47812 Spondylosis without myelopathy or radiculopathy, cervical region: Secondary | ICD-10-CM | POA: Diagnosis present

## 2020-06-29 LAB — HEMOGLOBIN A1C
Hgb A1c MFr Bld: 5 % (ref 4.8–5.6)
Mean Plasma Glucose: 96.8 mg/dL

## 2020-06-29 LAB — BASIC METABOLIC PANEL
Anion gap: 9 (ref 5–15)
BUN: 11 mg/dL (ref 6–20)
CO2: 26 mmol/L (ref 22–32)
Calcium: 9.1 mg/dL (ref 8.9–10.3)
Chloride: 103 mmol/L (ref 98–111)
Creatinine, Ser: 0.81 mg/dL (ref 0.61–1.24)
GFR, Estimated: >60 mL/min (ref >60)
Glucose, Bld: 91 mg/dL (ref 70–99)
Potassium: 3.9 mmol/L (ref 3.5–5.1)
Sodium: 138 mmol/L (ref 135–145)

## 2020-06-29 LAB — URINE DRUG SCREEN, QUALITATIVE (ARMC ONLY)
Amphetamines, Ur Screen: NOT DETECTED
Barbiturates, Ur Screen: NOT DETECTED
Benzodiazepine, Ur Scrn: NOT DETECTED
Cannabinoid 50 Ng, Ur ~~LOC~~: NOT DETECTED
Cocaine Metabolite,Ur ~~LOC~~: NOT DETECTED
MDMA (Ecstasy)Ur Screen: NOT DETECTED
Methadone Scn, Ur: NOT DETECTED
Opiate, Ur Screen: NOT DETECTED
Phencyclidine (PCP) Ur S: NOT DETECTED
Tricyclic, Ur Screen: NOT DETECTED

## 2020-06-29 LAB — HIV ANTIBODY (ROUTINE TESTING W REFLEX): HIV Screen 4th Generation wRfx: NONREACTIVE

## 2020-06-29 LAB — LIPID PANEL
Cholesterol: 196 mg/dL (ref 0–200)
HDL: 74 mg/dL (ref >40)
LDL Cholesterol: 112 mg/dL — ABNORMAL HIGH (ref 0–99)
Total CHOL/HDL Ratio: 2.6 RATIO
Triglycerides: 50 mg/dL (ref <150)
VLDL: 10 mg/dL (ref 0–40)

## 2020-06-29 LAB — CBC
HCT: 39.2 % (ref 39.0–52.0)
Hemoglobin: 12.8 g/dL — ABNORMAL LOW (ref 13.0–17.0)
MCH: 29.7 pg (ref 26.0–34.0)
MCHC: 32.7 g/dL (ref 30.0–36.0)
MCV: 91 fL (ref 80.0–100.0)
Platelets: 184 10*3/uL (ref 150–400)
RBC: 4.31 MIL/uL (ref 4.22–5.81)
RDW: 12.9 % (ref 11.5–15.5)
WBC: 10.2 10*3/uL (ref 4.0–10.5)
nRBC: 0 % (ref 0.0–0.2)

## 2020-06-29 IMAGING — MR MR HEAD W/O CM
7 series · 42 of 48 positions shown · non-contrast
Comparison: None.

EXAM:
MRI HEAD WITHOUT CONTRAST
TECHNIQUE: Multiplanar, multiecho pulse sequences of the brain and surrounding
structures were obtained without intravenous contrast.

[Series 5: ax dwi_tracew · axial · 3.0mm · 0.65mm/px · z∈[-129,+26]mm · 7 of 48 slices shown]
[im 1/48]
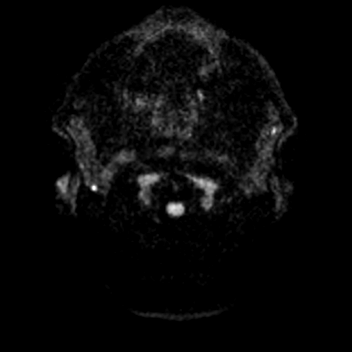
[im 8/48]
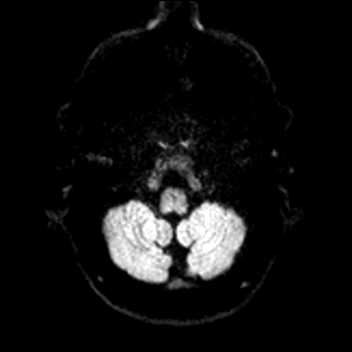
[im 16/48]
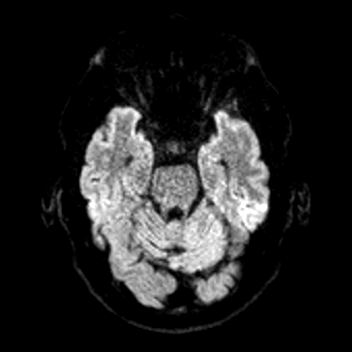
[im 24/48]
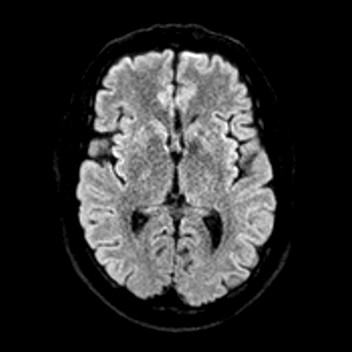
[im 32/48]
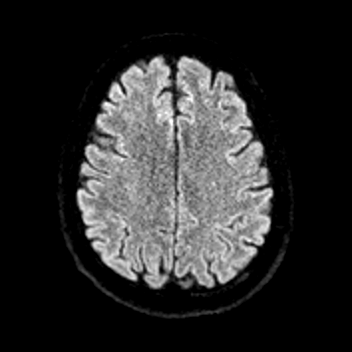
[im 40/48]
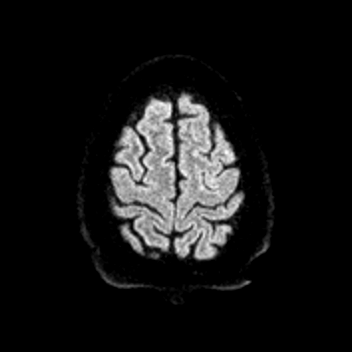
[im 48/48]
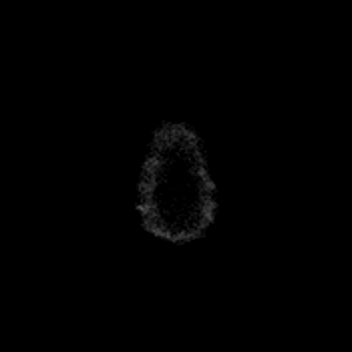

[Series 6: ax dwi_adc · axial · 3.0mm · 0.65mm/px · z∈[-129,+26]mm · 7 of 48 slices shown]
[im 1/48]
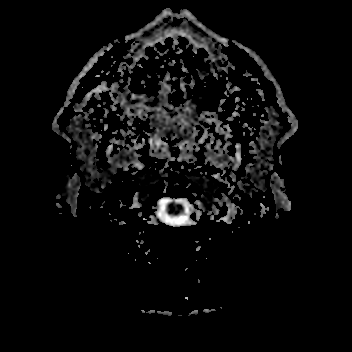
[im 8/48]
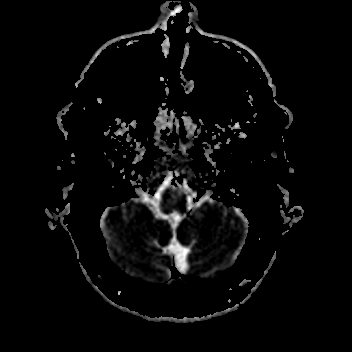
[im 16/48]
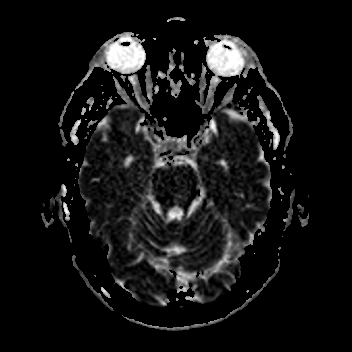
[im 24/48]
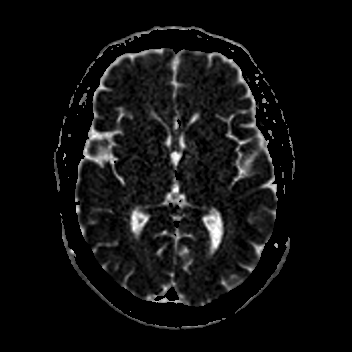
[im 32/48]
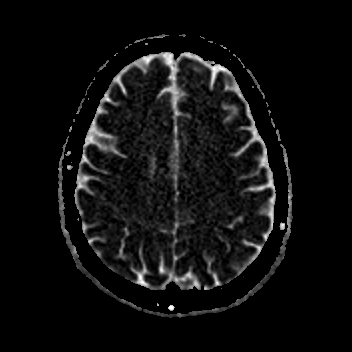
[im 40/48]
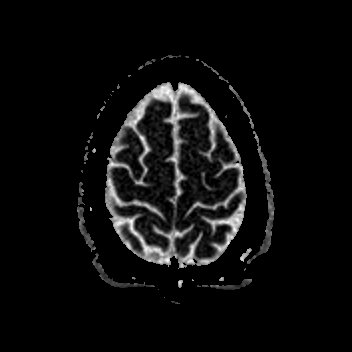
[im 48/48]
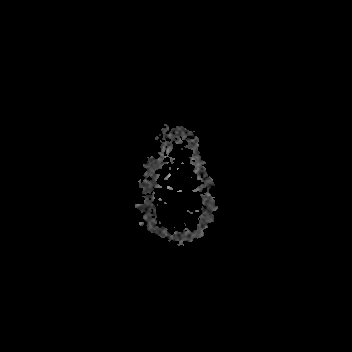

[Series 7: cor dwi_tracew · coronal · 5.0mm · 0.65mm/px · 5 of 40 slices shown]
[im 1/40]
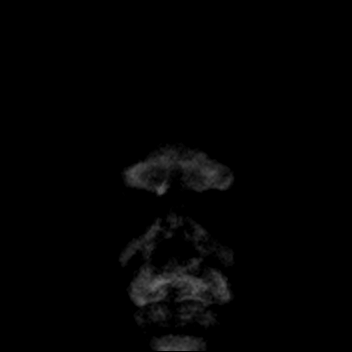
[im 10/40]
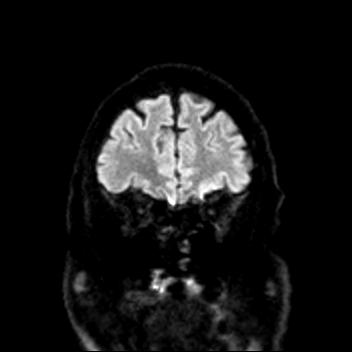
[im 20/40]
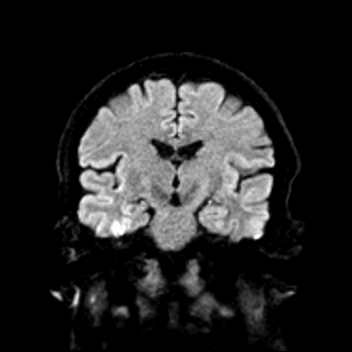
[im 30/40]
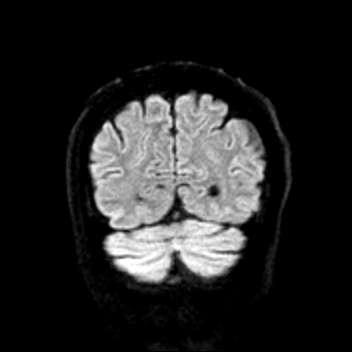
[im 40/40]
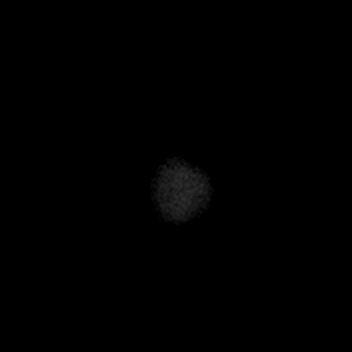

[Series 8: cor dwi_adc · coronal · 5.0mm · 0.65mm/px · 5 of 40 slices shown]
[im 1/40]
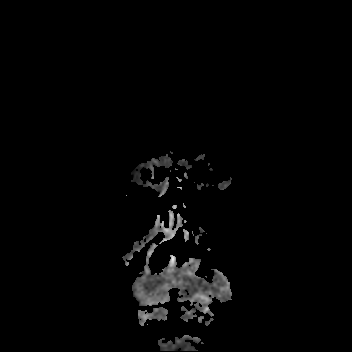
[im 10/40]
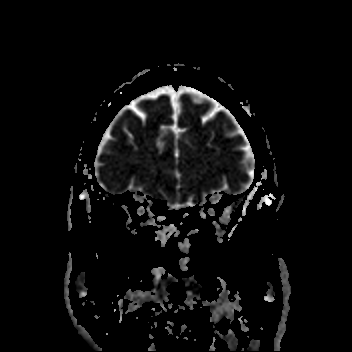
[im 20/40]
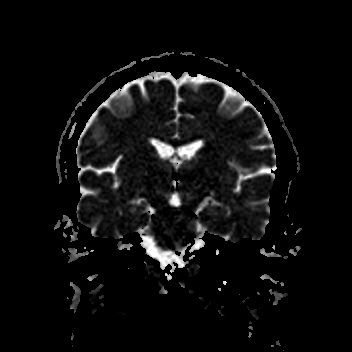
[im 30/40]
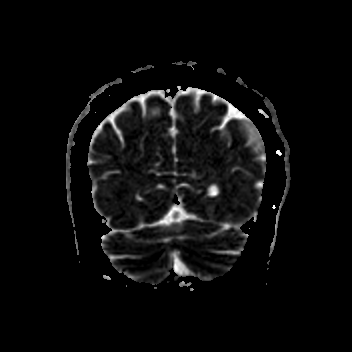
[im 40/40]
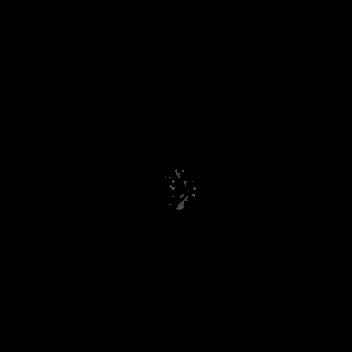

[Series 10: pha_images · axial · 3.0mm · 0.90mm/px · z∈[-139,+37]mm · 8 of 59 slices shown]
[im 1/59]
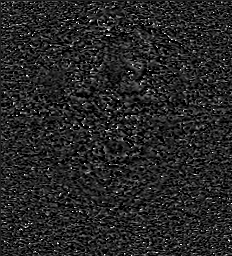
[im 9/59]
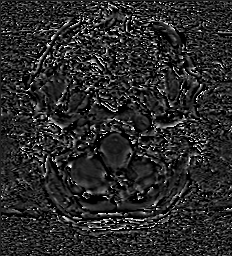
[im 17/59]
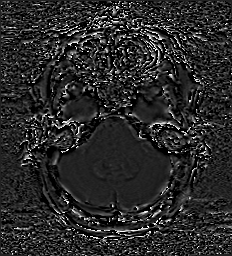
[im 25/59]
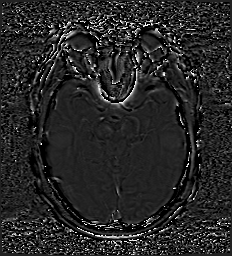
[im 34/59]
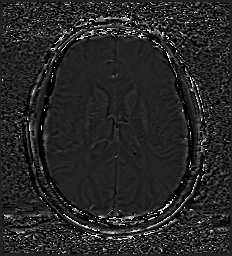
[im 42/59]
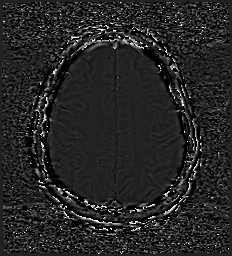
[im 50/59]
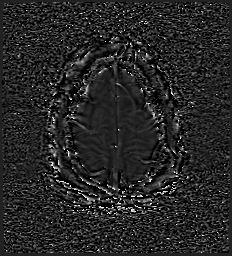
[im 59/59]
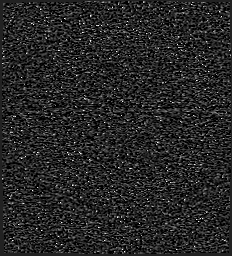

[Series 11: swi_images · axial · 3.0mm · 0.90mm/px · z∈[-139,-115]mm · 2 of 60 slices shown]
[im 1/60]
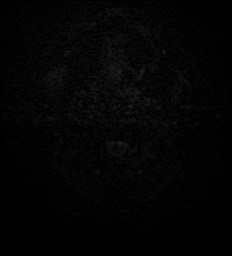
[im 9/60]
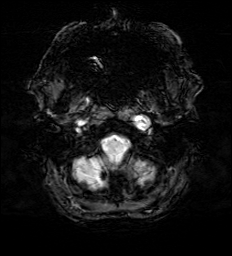

[Series 13: FLAIR · axial · 3.0mm · 0.53mm/px · z∈[-132,+29]mm · 8 of 55 slices shown]
[im 1/55]
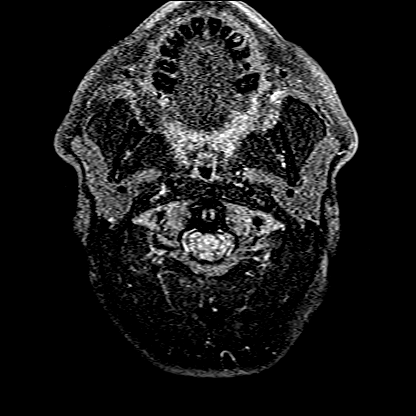
[im 8/55]
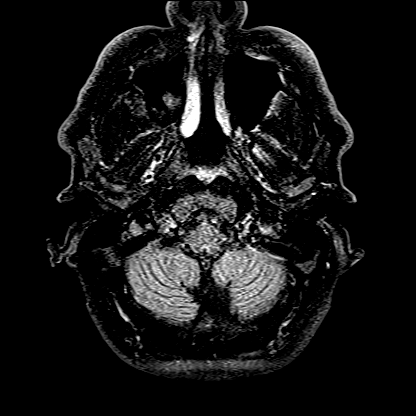
[im 16/55]
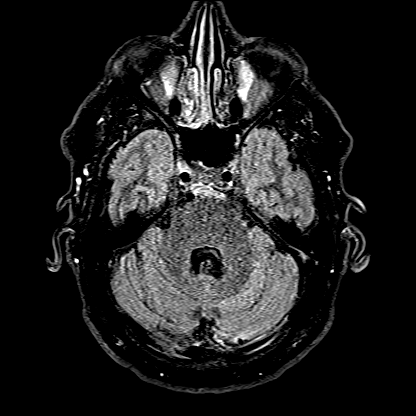
[im 24/55]
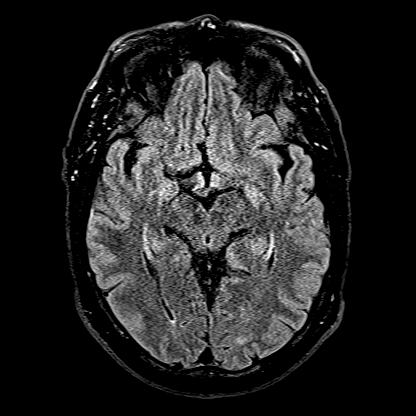
[im 31/55]
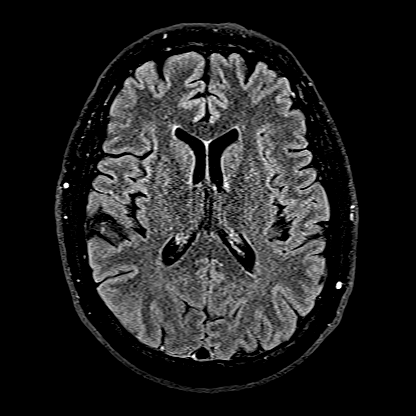
[im 39/55]
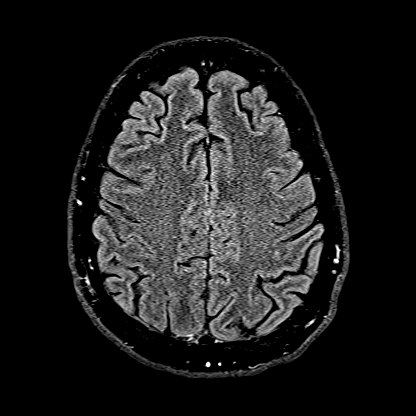
[im 47/55]
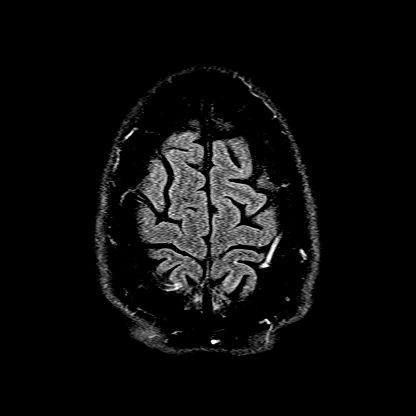
[im 55/55]
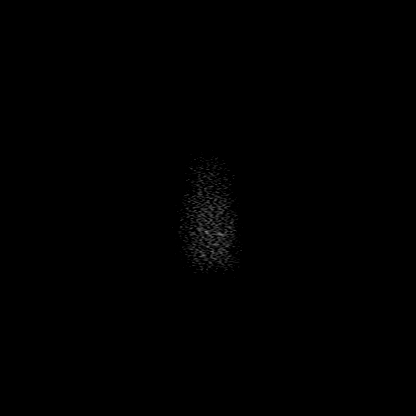

[42 of 48 positions shown; findings below may reference images not displayed]

FINDINGS: Per neurologist request limited scan performed with only diffusion,
SWI and FLAIR sequences. Within this limitation:

Brain: No acute infarction, hemorrhage, hydrocephalus, extra-axial
collection or mass lesion.

Vascular: Limited assessment without T2 sequence.

Skull and upper cervical spine: Limited assessment without evidence
of focal marrow abnormality.

Sinuses/Orbits: Right inferior maxillary sinus retention cyst. Mild
mucosal thickening of ethmoid air cells.

Other: No sizable mastoid effusions.
IMPRESSION: No evidence of acute abnormality on this limited protocol MRI. No
acute infarct.

## 2020-06-29 MED ORDER — SODIUM CHLORIDE 0.9 % IV SOLN: INTRAVENOUS | Status: DC | PRN

## 2020-06-29 MED ORDER — SODIUM CHLORIDE 0.9 % IV SOLN
300.0000 mg | Freq: Once | INTRAVENOUS | Status: DC
  Filled 2020-06-29: qty 15

## 2020-06-29 NOTE — Evaluation (Signed)
Occupational Therapy Evaluation Patient Details Name: Sean Copeland MRN: [id number] DOB: 01/14/1971 Today's Date: 06/29/2020    History of Present Illness Sean Copeland is a 50 y.o. male with medical history significant for psoriasis currently on stelara and prevously on methotrexate, alcoholic liver cirrhosis, alcohol use, tobacco abuse, history of DTs, history of abdominal ascites requiring paracentesis, presented to the emergency department for chief concerns of numbness to the left side of his body.   Clinical Impression   Pt was seen for OT evaluation this date. 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 Pt lives with his fiance in a 1 story home. Currently pt demonstrates impairments in cervical pain (with cervical extension and moderate pressure/touch - not new, typically uses [employer]), FMC/GMC and L side sensation as described below (See OT problem list) which minimally impact his ability to perform higher level FMC/GMC IADL tasks. Pt instructed in activities and strategies to support desensitization, safety, and use of LUE/LLE to improve FMC/GMC. Pt verbalized understanding. Pt would benefit from skilled OP OT services to address noted impairments and functional limitations (see below for any additional details) in order to maximize safety and independence for return to work. No additional acute OT needs.     Follow Up Recommendations  Outpatient OT (for sensory and higher level coordination deficits)    Equipment Recommendations  None recommended by OT    Recommendations for Other Services       Precautions / Restrictions Precautions Precautions: Fall Restrictions Weight Bearing Restrictions: No      Mobility Bed Mobility Overal bed mobility: Independent                  Transfers Overall transfer level: Independent                    Balance  Overall balance assessment: Modified Independent (pt endorses not feeling steady)                                         ADL either performed or assessed with clinical judgement   ADL Overall ADL's : Modified independent                                       General ADL Comments: no overt difficulty with [facility] tasks (opening containers, packets, etc) but pt reports feeling different in LUE     Vision Baseline Vision/History: No visual deficits Patient Visual Report: No change from baseline Vision Assessment?: No apparent visual deficits     Perception     Praxis      Pertinent Vitals/Pain Pain Assessment: Faces Faces Pain Scale: Hurts a little bit Pain Location: L side burning to touch Pain Descriptors / Indicators: Burning Pain Intervention(s): Limited activity within patient's tolerance;Monitored during session;Repositioned     Hand Dominance Right   Extremity/Trunk Assessment Upper Extremity Assessment Upper Extremity Assessment: LUE deficits/detail (RUE WFL) LUE Deficits / Details: grossly 4+/5, burning sensation with movement, tightness when making a fist with most burning sensation on dorsal aspect of hand, mild decreased FMC LUE Coordination: decreased fine motor   Lower Extremity Assessment Lower Extremity Assessment: LLE deficits/detail LLE Deficits / Details: LLE burning sensation, mild FMC/GMC impairments LLE Sensation: WNL  LLE Coordination: decreased gross motor;decreased fine motor   Cervical / Trunk Assessment Cervical / Trunk Assessment: Other exceptions Cervical / Trunk Exceptions: cervical spine pain with moderate pressure and active cervical extension (pt reports not new)   Communication Communication Communication: No difficulties   Cognition Arousal/Alertness: Awake/alert Behavior During Therapy: WFL for tasks assessed/performed Overall Cognitive Status: Within Functional Limits for tasks assessed                                      General Comments      Exercises Other Exercises Other Exercises: Pt instructed in activities and strategies to support desensitization, safety, and use of LUE/LLE to improve FMC/GMC   Shoulder Instructions      Home Living Family/patient expects to be discharged to:: Private residence Living Arrangements: Spouse/significant other Available Help at Discharge: Family;Available PRN/intermittently Type of Home: House       Home Layout: One level     Bathroom Shower/Tub: Producer, television/film/video: Standard     Home Equipment: Cane - single point          Prior Functioning/Environment Level of Independence: Independent        Comments: patient works full time, fully independent prior to admission. 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), heavy lifting, moving pallets        OT Problem List: Decreased strength;Decreased coordination;Impaired sensation      OT Treatment/Interventions:      OT Goals(Current goals can be found in the care plan section) Acute Rehab OT Goals Patient Stated Goal: get better OT Goal Formulation: All assessment and education complete, DC therapy  OT Frequency:     Barriers to D/C:            Co-evaluation              AM-PAC OT "6 Clicks" Daily Activity     Outcome Measure Help from another person eating meals?: None Help from another person taking care of personal grooming?: None Help from another person toileting, which includes using toliet, bedpan, or urinal?: None Help from another person bathing (including washing, rinsing, drying)?: None Help from another person to put on and taking off regular upper body clothing?: None Help from another person to put on and taking off regular lower body clothing?: None 6 Click Score: 24   End of Session    Activity Tolerance: Patient tolerated treatment well Patient left: in bed;with call bell/phone within reach  OT  Visit Diagnosis: Other abnormalities of gait and mobility (R26.89)                Time: 8527-7824 OT Time Calculation (min): 13 min Charges:  OT General Charges $OT Visit: 1 Visit OT Evaluation $OT Eval Low Complexity: 1 Low OT Treatments $Therapeutic Activity: 8-22 mins  Wynona Canes, MPH, MS, OTR/L ascom [phone] 06/29/20, 10:30 AM

## 2020-06-29 NOTE — Progress Notes (Signed)
Pt off unit to MRI at this time

## 2020-06-29 NOTE — TOC Progression Note (Signed)
Transition of Care [facility]) - Progression Note    Patient Details  Name: Sean Copeland MRN: [id number] Date of Birth: [birth year]  [facility] CM/SW Contact  Allayne Butcher, RN Phone Number: 06/29/2020, 12:16 PM  Clinical Narrative:    RNCM unable to complete consult.  Patient left AMA.         Expected Discharge Plan and Services                                                 Social Determinants of Health (SDOH) Interventions    Readmission Risk Interventions No flowsheet data found.

## 2020-06-29 NOTE — Progress Notes (Signed)
STROKE TEAM PROGRESS NOTE   SUBJECTIVE (INTERVAL HISTORY) His girlfriend is at the bedside.  Overall his condition is unchanged. He continues to complain of left face arm and leg numbness today, but less tingling than yesterday. He walks better than yesterday in ER. Worked with PT and no need PT follow up. OT recommend outpt OT. MRI repeat again neg. Pt requested to go home.    OBJECTIVE Temp:  [98.4 F (36.9 C)-99.2 F (37.3 C)] 98.4 F (36.9 C) (03/11 0445) Pulse Rate:  [94-115] 94 (03/11 0445) Cardiac Rhythm: Sinus tachycardia (03/10 1900) Resp:  [16-20] 16 (03/11 0445) BP: (123-140)/(69-79) 132/79 (03/11 0445) SpO2:  [96 %-99 %] 97 % (03/11 0445)  No results for input(s): GLUCAP in the last 168 hours. Recent Labs  Lab 06/28/20 0851 06/28/20 1334 06/29/20 0416  NA 141 139 138  K 3.6 3.3* 3.9  CL 102 103 103  CO2 29 25 26   GLUCOSE 111* 94 91  BUN 9 8 11   CREATININE 0.98 0.83 0.81  CALCIUM 8.7* 8.6* 9.1  MG  --  1.6*  --   PHOS  --  3.0  --    Recent Labs  Lab 06/28/20 0851 06/28/20 1334  AST 77* 70*  ALT 48* 47*  ALKPHOS 89 80  BILITOT 1.1 1.4*  PROT 8.1 7.6  ALBUMIN 3.9 3.8   Recent Labs  Lab 06/28/20 0851 06/28/20 1334 06/29/20 0416  WBC 8.3 9.5 10.2  NEUTROABS 6.0  --   --   HGB 13.6 12.6* 12.8*  HCT 42.3 38.1* 39.2  MCV 91.6 91.1 91.0  PLT 209 200 184   No results for input(s): CKTOTAL, CKMB, CKMBINDEX, TROPONINI in the last 168 hours. Recent Labs    06/28/20 0851  LABPROT 15.1  INR 1.2   No results for input(s): COLORURINE, LABSPEC, PHURINE, GLUCOSEU, HGBUR, BILIRUBINUR, KETONESUR, PROTEINUR, UROBILINOGEN, NITRITE, LEUKOCYTESUR in the last 72 hours.  Invalid input(s): APPERANCEUR     Component Value Date/Time   CHOL 196 06/29/2020 0416   TRIG 50 06/29/2020 0416   HDL 74 06/29/2020 0416   CHOLHDL 2.6 06/29/2020 0416   VLDL 10 06/29/2020 0416   LDLCALC 112 (H) 06/29/2020 0416   Lab Results  Component Value Date   HGBA1C 5.0 06/29/2020       Component Value Date/Time   LABOPIA NONE DETECTED 06/28/2020 1030   COCAINSCRNUR NONE DETECTED 06/28/2020 1030   LABBENZ NONE DETECTED 06/28/2020 1030   AMPHETMU NONE DETECTED 06/28/2020 1030   THCU NONE DETECTED 06/28/2020 1030   LABBARB NONE DETECTED 06/28/2020 1030    No results for input(s): ETH in the last 168 hours.  I have personally reviewed the radiological images below and agree with the radiology interpretations.  CT ANGIOGRAM HEAD NECK W WO CONTRAST  Result Date: 06/28/2020 CLINICAL DATA:  Woke today with numbness and tingling of the left side of the body. EXAM: CT ANGIOGRAPHY HEAD AND NECK TECHNIQUE: Multidetector CT imaging of the head and neck was performed using the standard protocol during bolus administration of intravenous contrast. Multiplanar CT image reconstructions and MIPs were obtained to evaluate the vascular anatomy. Carotid stenosis measurements (when applicable) are obtained utilizing NASCET criteria, using the distal internal carotid diameter as the denominator. CONTRAST:  63mL OMNIPAQUE IOHEXOL 350 MG/ML SOLN COMPARISON:  Head CT earlier same day.  MRI earlier same day. FINDINGS: CTA NECK FINDINGS Aortic arch: Normal. No visible atherosclerotic change. Branching pattern is normal. Right carotid system: Common carotid artery widely patent to the  bifurcation. The carotid bifurcation is normal without soft or calcified plaque. Cervical ICA is normal. Left carotid system: Common carotid artery widely patent to the bifurcation. Carotid bifurcation is normal. No atherosclerotic plaque. Cervical ICA is normal. Vertebral arteries: Both vertebral artery origins are widely patent. Both vertebral arteries are normal through the cervical region to the foramen magnum. Skeleton: Mid cervical spondylosis with moderate stenosis at C5-6 and C6-7. Other neck: No mass or lymphadenopathy. Upper chest: Normal Review of the MIP images confirms the above findings CTA HEAD FINDINGS  Anterior circulation: Both internal carotid arteries are patent through the skull base and siphon regions. The anterior and middle cerebral vessels are patent without proximal stenosis, aneurysm or vascular malformation. No large or medium vessel occlusion. Posterior circulation: Both vertebral arteries widely patent to the basilar. No basilar stenosis. Posterior circulation branch vessels appear normal. Venous sinuses: Patent and normal. Anatomic variants: None significant. Review of the MIP images confirms the above findings IMPRESSION: 1. Normal CT angiography of the neck and head. No intracranial large or medium vessel occlusion or correctable proximal stenosis. 2. 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 Electronically Signed   By: Paulina Fusi M.D.   On: 06/28/2020 20:05   CT Head Wo Contrast  Result Date: 06/28/2020 CLINICAL DATA:  Left-sided numbness and tingling since this a.m. EXAM: CT HEAD WITHOUT CONTRAST TECHNIQUE: Contiguous axial images were obtained from the base of the skull through the vertex without intravenous contrast. COMPARISON:  None. FINDINGS: Brain: No evidence of acute infarction, hemorrhage, hydrocephalus, extra-axial collection or mass lesion/mass effect. Vascular: No hyperdense vessel or unexpected calcification. Skull: Normal. Negative for fracture or focal lesion. Sinuses/Orbits: No acute finding. Other: None. IMPRESSION: No acute intracranial pathology. Electronically Signed   By: Maudry Mayhew MD   On: 06/28/2020 10:02   MR BRAIN WO CONTRAST  Result Date: 06/29/2020 EXAM: MRI HEAD WITHOUT CONTRAST TECHNIQUE: Multiplanar, multiecho pulse sequences of the brain and surrounding structures were obtained without intravenous contrast. COMPARISON:  None. FINDINGS: Per neurologist request limited scan performed with only diffusion, SWI and FLAIR sequences. Within this limitation: Brain: No acute infarction, hemorrhage, hydrocephalus, extra-axial collection  or mass lesion. Vascular: Limited assessment without T2 sequence. Skull and upper cervical spine: Limited assessment without evidence of focal marrow abnormality. Sinuses/Orbits: Right inferior maxillary sinus retention cyst. Mild mucosal thickening of ethmoid air cells. Other: No sizable mastoid effusions. IMPRESSION: No evidence of acute abnormality on this limited protocol MRI. No acute infarct. Electronically Signed   By: Feliberto Harts MD   On: 06/29/2020 07:30   MR BRAIN WO CONTRAST  Result Date: 06/28/2020 CLINICAL DATA:  Left-sided numbness EXAM: MRI HEAD WITHOUT CONTRAST TECHNIQUE: Multiplanar, multiecho pulse sequences of the brain and surrounding structures were obtained without intravenous contrast. COMPARISON:  None. FINDINGS: Brain: There is no acute infarction or intracranial hemorrhage. There is no intracranial mass, mass effect, or edema. There is no hydrocephalus or extra-axial fluid collection. Ventricles and sulci are normal in size and configuration. Vascular: Major vessel flow voids at the skull base are preserved. Skull and upper cervical spine: Normal marrow signal is preserved. Sinuses/Orbits: Minor mucosal thickening.  Orbits are unremarkable. Other: Sella is unremarkable.  Mastoid air cells are clear. IMPRESSION: No evidence of recent infarction, hemorrhage, or mass. Electronically Signed   By: Guadlupe Spanish M.D.   On: 06/28/2020 11:53   US LIVER DOPPLER  Result Date: 06/28/2020 CLINICAL DATA:  Abdominal pain, hemoptysis, history of GI bleed EXAM: DUPLEX ULTRASOUND  OF LIVER TECHNIQUE: Color and duplex Doppler ultrasound was performed to evaluate the hepatic in-flow and out-flow vessels. COMPARISON:  None. FINDINGS: Liver: Increased echogenicity surface nodularity suggesting hepatic steatosis. No intrahepatic biliary dilatation. No large focal mass or intrahepatic biliary dilatation Main Portal Vein size: 1.7 cm Portal Vein Velocities Main Prox:  33 cm/sec Main Mid: 34 cm/sec  Main Dist:  31 cm/sec Right: 24 cm/sec Left: 416 cm/sec Hepatic Vein Velocities Right:  27 cm/sec Middle:  24 cm/sec Left:  36 cm/sec IVC: Present and patent with normal respiratory phasicity. Hepatic Artery Velocity:  130 cm/sec Splenic Vein Velocity:  22 cm/sec Spleen: 7.9 cm x 11.7 cm x 5.5 cm with a total volume of 267 cm^3 (411 cm^3 is upper limit normal) Portal Vein Occlusion/Thrombus: No Splenic Vein Occlusion/Thrombus: No Ascites: None Varices: None Gallbladder is nondistended but appears to contain small gallstones. Patent portal, hepatic and splenic veins with normal directional flow. Portal vein is dilated measuring 1.7 cm in diameter suggesting component of portal hypertension. No portal vein occlusion or thrombus. IMPRESSION: Findings suggesting hepatic cirrhosis. Dilated portal vein suggesting component of portal hypertension but with preserved hepatopetal flow. Electronically Signed   By: M.  Shick M.D.   Judie Petitn: 06/28/2020 16:08    PHYSICAL EXAM  Temp:  [98.4 F (36.9 C)-99.2 F (37.3 C)] 98.4 F (36.9 C) (03/11 0445) Pulse Rate:  [94-115] 94 (03/11 0445) Resp:  [16-20] 16 (03/11 0445) BP: (123-140)/(69-79) 132/79 (03/11 0445) SpO2:  [96 %-99 %] 97 % (03/11 0445)  General - well nourished, well developed, in no apparent distress.    Ophthalmologic - fundi not visualized due to noncooperation.    Cardiovascular - regular rhythm and rate  Mental Status -  Level of arousal and orientation to time, place, and person were intact. Language including expression, naming, repetition, comprehension, reading, and writing was assessed and found intact. Attention span and concentration were normal. Fund of Knowledge was assessed and was intact.  Cranial Nerves II - XII - II - Vision intact OU. III, IV, VI - Extraocular movements intact. V - Facial sensation decreased on the left. VII - Facial movement intact bilaterally. VIII - Hearing & vestibular intact bilaterally. X - Palate  elevates symmetrically. XI - Chin turning & shoulder shrug intact bilaterally. XII - Tongue protrusion intact.  Motor Strength - The patient's strength was normal bilaterally.    Motor Tone & Bulk - Muscle tone was assessed at the neck and appendages and was normal.  Bulk was normal and fasciculations were absent.   Reflexes - The patient's reflexes were 2+ b/l patellar reflexes, otherwise diminished. He had no pathological reflexes.  Sensory - Light touch, temperature/pinprick were assessed and were decreased on the left UE and LE    Coordination - The patient had normal movements in the hands with no ataxia or dysmetria.  Tremor was absent.  Gait and Station - walked him in the room, mild limping on the left with knee extension on walking due to left knee pain. Denies left knee pain.    ASSESSMENT/PLAN Sean Copeland is a 50 y.o. male with history of alcoholic cirrhosis, psoriasis, HTN presented to ED for left sided numbness and nose bleeding with vomiting blood. CT, CTA head and neck and MRI brain no acute finding. Repeat MRI brain today again no acute finding. Symptom largely unchanged but his strength now at baseline and PT has no recs. LDL 112 and A1C 5.0, not quite sure the etiology now, but DDx include  DWI-negative stroke, migraine equivalent, or conversion disorder. Will not start antiplatelet due to nose vs. GI bleeding, and no statin given liver disease with elevated LFTs. noted plan for EGD tomorrow but pt wants to go home.   Plan:  Pt cleared from neuro standpoint for EGD tomorrow as planned.  Follow up with PCP in [place] closely  Expect symptoms improving over time, if not improving in a week, will need to consider EMG and NCS. Pt will follow up with PCP in [place] to see if need [facility]  No antiplatelet for now given bleeding  No statin for now given elevated LFTs  Discussed with attending Dr. Robb Matar  Neurology will sign off. Please call with questions.  Thanks for the consult.   Hospital day # 0   Marvel Plan, MD PhD Stroke Neurology 06/29/2020 11:31 AM    To contact Stroke Continuity provider, please refer to [email or web address]. After hours, contact General Neurology

## 2020-06-29 NOTE — Progress Notes (Signed)
Reached out to MD per patient request regarding wanting to discharge today. MD stated barriers are in place and discharge would not occur. Attempted to call patient in room to make him aware. Perlie Mayo, RN

## 2020-06-29 NOTE — Progress Notes (Signed)
Shift Summary: No acute events overnight. CIWA and neuro checks performed per order, no changes overnight. NSR/ST on telemetry, remains on RA. Pending MRI this AM. Fall/safety precautions in place, rounding performed, needs/concerns addressed. Girlfriend called x2 overnight and updated regarding pt's status, questions answered.

## 2020-06-29 NOTE — Progress Notes (Signed)
[facility] PROGRESS NOTE    Progress Note  Sean Copeland  TDD:[id number] DOB: December 23, 1970 DOA: 06/28/2020 PCP: [facility]     Brief Narrative:   Sean Copeland is an 50 y.o. male past medical history significant for psoriatic arthritis on Stelara previously on methotrexate, alcoholic liver cirrhosis, alcohol abuse history of DTs, also with recurrent paracentesis due to ascites.  Comes into the emergency room for numbness of the left side of her body.  He endorses that he had an episode of hematemesis around 6 AM and has had dark tarry stool for the past 2 days.   Significant studies: 06/28/2020 CT of the head showed no acute findings. 06/28/2020 MRI of the brain showed no acute infarct hemorrhages or masses. 06/28/2020 liver Doppler showed cirrhosis and dilated portal vein. 06/28/2020 CT angio of the head and neck showed no significant intracranial or medium vessel occlusions, did show cervical spondylosis with moderate stenosis C5-C6 and C6-C7. 06/29/2020 MRI of the brain parts.  Antibiotics: None  Microbiology data: Blood culture:  Procedures: None  Assessment/Plan:  Hematemesis: He relate he had an episode of epistaxis  Dr. Roxine Caddy from GI has been consulted. Her hemoglobin is at 12 unlikely to have a upper GI bleed from varices, repeated CBC this morning her hemoglobin remains at 12. Ultrasound of the liver showed hepatic cirrhosis with dilated portal vein compatible with portal hypertension. Repeat a CBC tomorrow morning.  Left-sided weakness question due to cervical myelopathy: CT angio of the head and neck showed no significant stenosis, mild cervical spondylosis of C5-C6 MRI of the brain on 06/28/2020 showed no acute findings. Continue to monitor on telemetry, frequent neurochecks. Neurology was consulted recommended to repeat the MRI this morning Fasting lipid panel showed an LDL of 112 HDL 74, due to cirrhosis question she is a candidate for statins.  UDS  is pending. His left-sided weakness can probably be due to mild cervical spondylosis of C5-C6. It is kind of concerning that he is in different about having a stroke and significant hematemesis.  Question the significance  H/O cirrhosis due to alcoholic liver: Prescribed by her PCP Aldactone lactulose and furosemide, she has been noncompliant with them for several months.  Chronic low back pain: Continue gabapentin.  History of alcohol abuse: Continue to monitor with CIWA protocol for signs of withdrawal.   DVT prophylaxis: scd Family Communication:none  Status is: Observation  The patient will require care spanning > 2 midnights and should be moved to inpatient because: Hemodynamically unstable  Dispo: The patient is from: Home              Anticipated d/c is to: Home              Patient currently is not medically stable to d/c.   Difficult to place patient No        Code Status:     Code Status Orders  (From admission, onward)         Start     Ordered   06/28/20 1457  Full code  Continuous        06/28/20 1457        Code Status History    This patient has a current code status but no historical code status.   Advance Care Planning Activity        IV Access:    Peripheral IV   Procedures and diagnostic studies:   CT ANGIOGRAM HEAD NECK W WO CONTRAST  Result Date: 06/28/2020 CLINICAL  DATA:  Woke today with numbness and tingling of the left side of the body. EXAM: CT ANGIOGRAPHY HEAD AND NECK TECHNIQUE: Multidetector CT imaging of the head and neck was performed using the standard protocol during bolus administration of intravenous contrast. Multiplanar CT image reconstructions and MIPs were obtained to evaluate the vascular anatomy. Carotid stenosis measurements (when applicable) are obtained utilizing NASCET criteria, using the distal internal carotid diameter as the denominator. CONTRAST:  75mL OMNIPAQUE IOHEXOL 350 MG/ML SOLN COMPARISON:  Head CT  earlier same day.  MRI earlier same day. FINDINGS: CTA NECK FINDINGS Aortic arch: Normal. No visible atherosclerotic change. Branching pattern is normal. Right carotid system: Common carotid artery widely patent to the bifurcation. The carotid bifurcation is normal without soft or calcified plaque. Cervical ICA is normal. Left carotid system: Common carotid artery widely patent to the bifurcation. Carotid bifurcation is normal. No atherosclerotic plaque. Cervical ICA is normal. Vertebral arteries: Both vertebral artery origins are widely patent. Both vertebral arteries are normal through the cervical region to the foramen magnum. Skeleton: Mid cervical spondylosis with moderate stenosis at C5-6 and C6-7. Other neck: No mass or lymphadenopathy. Upper chest: Normal Review of the MIP images confirms the above findings CTA HEAD FINDINGS Anterior circulation: Both internal carotid arteries are patent through the skull base and siphon regions. The anterior and middle cerebral vessels are patent without proximal stenosis, aneurysm or vascular malformation. No large or medium vessel occlusion. Posterior circulation: Both vertebral arteries widely patent to the basilar. No basilar stenosis. Posterior circulation branch vessels appear normal. Venous sinuses: Patent and normal. Anatomic variants: None significant. Review of the MIP images confirms the above findings IMPRESSION: 1. Normal CT angiography of the neck and head. No intracranial large or medium vessel occlusion or correctable proximal stenosis. 2. 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 Electronically Signed   By: Paulina FusiMark  Shogry M.D.   On: 06/28/2020 20:05   CT Head Wo Contrast  Result Date: 06/28/2020 CLINICAL DATA:  Left-sided numbness and tingling since this a.m. EXAM: CT HEAD WITHOUT CONTRAST TECHNIQUE: Contiguous axial images were obtained from the base of the skull through the vertex without intravenous contrast. COMPARISON:  None.  FINDINGS: Brain: No evidence of acute infarction, hemorrhage, hydrocephalus, extra-axial collection or mass lesion/mass effect. Vascular: No hyperdense vessel or unexpected calcification. Skull: Normal. Negative for fracture or focal lesion. Sinuses/Orbits: No acute finding. Other: None. IMPRESSION: No acute intracranial pathology. Electronically Signed   By: Maudry MayhewJeffrey  Waltz MD   On: 06/28/2020 10:02   MR BRAIN WO CONTRAST  Result Date: 06/28/2020 CLINICAL DATA:  Left-sided numbness EXAM: MRI HEAD WITHOUT CONTRAST TECHNIQUE: Multiplanar, multiecho pulse sequences of the brain and surrounding structures were obtained without intravenous contrast. COMPARISON:  None. FINDINGS: Brain: There is no acute infarction or intracranial hemorrhage. There is no intracranial mass, mass effect, or edema. There is no hydrocephalus or extra-axial fluid collection. Ventricles and sulci are normal in size and configuration. Vascular: Major vessel flow voids at the skull base are preserved. Skull and upper cervical spine: Normal marrow signal is preserved. Sinuses/Orbits: Minor mucosal thickening.  Orbits are unremarkable. Other: Sella is unremarkable.  Mastoid air cells are clear. IMPRESSION: No evidence of recent infarction, hemorrhage, or mass. Electronically Signed   By: Guadlupe SpanishPraneil  Patel M.D.   On: 06/28/2020 11:53   US LIVER DOPPLER  Result Date: 06/28/2020 CLINICAL DATA:  Abdominal pain, hemoptysis, history of GI bleed EXAM: DUPLEX ULTRASOUND OF LIVER TECHNIQUE: Color and duplex Doppler ultrasound was  performed to evaluate the hepatic in-flow and out-flow vessels. COMPARISON:  None. FINDINGS: Liver: Increased echogenicity surface nodularity suggesting hepatic steatosis. No intrahepatic biliary dilatation. No large focal mass or intrahepatic biliary dilatation Main Portal Vein size: 1.7 cm Portal Vein Velocities Main Prox:  33 cm/sec Main Mid: 34 cm/sec Main Dist:  31 cm/sec Right: 24 cm/sec Left: 416 cm/sec Hepatic Vein  Velocities Right:  27 cm/sec Middle:  24 cm/sec Left:  36 cm/sec IVC: Present and patent with normal respiratory phasicity. Hepatic Artery Velocity:  130 cm/sec Splenic Vein Velocity:  22 cm/sec Spleen: 7.9 cm x 11.7 cm x 5.5 cm with a total volume of 267 cm^3 (411 cm^3 is upper limit normal) Portal Vein Occlusion/Thrombus: No Splenic Vein Occlusion/Thrombus: No Ascites: None Varices: None Gallbladder is nondistended but appears to contain small gallstones. Patent portal, hepatic and splenic veins with normal directional flow. Portal vein is dilated measuring 1.7 cm in diameter suggesting component of portal hypertension. No portal vein occlusion or thrombus. IMPRESSION: Findings suggesting hepatic cirrhosis. Dilated portal vein suggesting component of portal hypertension but with preserved hepatopetal flow. Electronically Signed   By: Judie Petit.  Shick M.D.   On: 06/28/2020 16:08     Medical Consultants:    None.   Subjective:    Sean Copeland he relates no further episodes of hematemesis. He continues to be weak on the left side no improvement no worsening.  Objective:    Vitals:   06/28/20 1518 06/28/20 2008 06/29/20 0110 06/29/20 0445  BP: 124/69 123/69 140/74 132/79  Pulse: (!) 104 (!) 103 94 94  Resp: 20 16 18 16   Temp: 99.2 F (37.3 C) 98.7 F (37.1 C) 98.6 F (37 C) 98.4 F (36.9 C)  TempSrc: Oral Oral Oral Oral  SpO2: 98% 99% 96% 97%  Weight:      Height:       SpO2: 97 %   Intake/Output Summary (Last 24 hours) at 06/29/2020 0725 Last data filed at 06/29/2020 0503 Gross per 24 hour  Intake 118 ml  Output 500 ml  Net -382 ml   Filed Weights   06/28/20 0848  Weight: 70.3 kg    Exam: General exam: In no acute distress. Respiratory system: Good air movement and clear to auscultation. Cardiovascular system: S1 & S2 heard, RRR. No JVD. Gastrointestinal system: Abdomen is nondistended, soft and nontender.  Central nervous system: Awake alert and oriented x3 3-12 are  grossly intact no facial droop left upper extremity is about 4/5, left lower extremity is a 5.5 the right side of his neurological exam is intact. Extremities: No pedal edema. Skin: No rashes, lesions or ulcers Psychiatry: Judgement and insight appear normal. Mood & affect appropriate.    Data Reviewed:    Labs: Basic Metabolic Panel: Recent Labs  Lab 06/28/20 0851 06/28/20 1334 06/29/20 0416  NA 141 139 138  K 3.6 3.3* 3.9  CL 102 103 103  CO2 29 25 26   GLUCOSE 111* 94 91  BUN 9 8 11   CREATININE 0.98 0.83 0.81  CALCIUM 8.7* 8.6* 9.1  MG  --  1.6*  --   PHOS  --  3.0  --    GFR Estimated Creatinine Clearance: 108.5 mL/min (by C-G formula based on SCr of 0.81 mg/dL). Liver Function Tests: Recent Labs  Lab 06/28/20 0851 06/28/20 1334  AST 77* 70*  ALT 48* 47*  ALKPHOS 89 80  BILITOT 1.1 1.4*  PROT 8.1 7.6  ALBUMIN 3.9 3.8   No results for input(s):  LIPASE, AMYLASE in the last 168 hours. Recent Labs  Lab 06/28/20 1334  AMMONIA 14   Coagulation profile Recent Labs  Lab 06/28/20 0851  INR 1.2   COVID-19 Labs  Recent Labs    06/28/20 1334  FERRITIN 16*    Lab Results  Component Value Date   SARSCOV2NAA NEGATIVE 06/28/2020    CBC: Recent Labs  Lab 06/28/20 0851 06/28/20 1334 06/29/20 0416  WBC 8.3 9.5 10.2  NEUTROABS 6.0  --   --   HGB 13.6 12.6* 12.8*  HCT 42.3 38.1* 39.2  MCV 91.6 91.1 91.0  PLT 209 200 184   Cardiac Enzymes: No results for input(s): CKTOTAL, CKMB, CKMBINDEX, TROPONINI in the last 168 hours. BNP (last 3 results) No results for input(s): PROBNP in the last 8760 hours. CBG: No results for input(s): GLUCAP in the last 168 hours. D-Dimer: No results for input(s): DDIMER in the last 72 hours. Hgb A1c: No results for input(s): HGBA1C in the last 72 hours. Lipid Profile: Recent Labs    06/29/20 0416  CHOL 196  HDL 74  LDLCALC 112*  TRIG 50  CHOLHDL 2.6   Thyroid function studies: No results for input(s): TSH,  T4TOTAL, T3FREE, THYROIDAB in the last 72 hours.  Invalid input(s): FREET3 Anemia work up: Recent Labs    06/28/20 0851 06/28/20 1334  VITAMINB12 301  --   FOLATE  --  9.0  FERRITIN  --  16*  TIBC  --  477*  IRON  --  59   Sepsis Labs: Recent Labs  Lab 06/28/20 0851 06/28/20 1334 06/29/20 0416  WBC 8.3 9.5 10.2   Microbiology Recent Results (from the past 240 hour(s))  Resp Panel by RT-PCR (Flu A&B, Covid) Nasopharyngeal Swab     Status: None   Collection Time: 06/28/20  1:20 PM   Specimen: Nasopharyngeal Swab; Nasopharyngeal(NP) swabs in vial transport medium  Result Value Ref Range Status   SARS Coronavirus 2 by RT PCR NEGATIVE NEGATIVE Final    Comment: (NOTE) SARS-CoV-2 target nucleic acids are NOT DETECTED.  The SARS-CoV-2 RNA is generally detectable in upper respiratory specimens during the acute phase of infection. The lowest concentration of SARS-CoV-2 viral copies this assay can detect is 138 copies/mL. A negative result does not preclude SARS-Cov-2 infection and should not be used as the sole basis for treatment or other patient management decisions. A negative result may occur with  improper specimen collection/handling, submission of specimen other than nasopharyngeal swab, presence of viral mutation(s) within the areas targeted by this assay, and inadequate number of viral copies(<138 copies/mL). A negative result must be combined with clinical observations, patient history, and epidemiological information. The expected result is Negative.  Fact Sheet for Patients:  BloggerCourse.com  Fact Sheet for Healthcare Providers:  SeriousBroker.it  This test is no t yet approved or cleared by the [place] FDA and  has been authorized for detection and/or diagnosis of SARS-CoV-2 by FDA under an Emergency Use Authorization (EUA). This EUA will remain  in effect (meaning this test can be used) for the  duration of the COVID-19 declaration under Section 564(b)(1) of the Act, 21 U.S.C.section 360bbb-3(b)(1), unless the authorization is terminated  or revoked sooner.       Influenza A by PCR NEGATIVE NEGATIVE Final   Influenza B by PCR NEGATIVE NEGATIVE Final    Comment: (NOTE) The Xpert Xpress SARS-CoV-2/FLU/RSV plus assay is intended as an aid in the diagnosis of influenza from Nasopharyngeal swab specimens and should not  be used as a sole basis for treatment. Nasal washings and aspirates are unacceptable for Xpert Xpress SARS-CoV-2/FLU/RSV testing.  Fact Sheet for Patients: BloggerCourse.com  Fact Sheet for Healthcare Providers: SeriousBroker.it  This test is not yet approved or cleared by the [place] FDA and has been authorized for detection and/or diagnosis of SARS-CoV-2 by FDA under an Emergency Use Authorization (EUA). This EUA will remain in effect (meaning this test can be used) for the duration of the COVID-19 declaration under Section 564(b)(1) of the Act, 21 U.S.C. section 360bbb-3(b)(1), unless the authorization is terminated or revoked.  Performed at [facility], [address] [place], [place] [address]      Medications:   . folic acid  1 mg Oral Daily  . gabapentin  200 mg Oral QHS  . multivitamin with minerals  1 tablet Oral Daily  . thiamine  100 mg Oral Daily   Or  . thiamine  100 mg Intravenous Daily   Continuous Infusions:    LOS: 0 days   Marinda Elk  [facility]  06/29/2020, 7:25 AM

## 2020-06-29 NOTE — Progress Notes (Signed)
Initial Nutrition Assessment  DOCUMENTATION CODES:   Not applicable  INTERVENTION:  Pt declines Ensure supplement, reports going home this afternoon  Continue MVI, Folic acid, Thiamine  Pt high refeeding risk given electrolyte abnormalities on admission and history of  EtOH abuse, continue to monitor magnesium, potassium, and phosphorus daily  NUTRITION DIAGNOSIS:   Increased nutrient needs related to chronic illness (EtOH liver cirrhosis) as evidenced by estimated needs.    GOAL:   Patient will meet greater than or equal to 90% of their needs   MONITOR:   Labs,I & O's,PO intake,Weight trends,Skin,Supplement acceptance  REASON FOR ASSESSMENT:   Malnutrition Screening Tool    ASSESSMENT:  50 year old male admitted for hematemesis presented after waking up with left-sided numbness, nosebleed, and vomiting blood with report of dark tarry stools over the last 2 days. Past medical history significant of psoriasis currently on Stelara, alcoholic liver cirrhosis, abdmonial ascites requiring paracentesis, DT's, and currently consumes alcohol 3-4 times per week.  RD working remotely.  Briefly spoke with pt via phone this morning. Reports good appetite, recalls 100% of [place] toast, bacon, eggs and coffee for breakfast. He endorses some left sided facial numbness, denies any chewing/swallowing difficulties. Pt reports  usually eating one meal/day (dinner),  occassionaly eats lunch and recalls green machine and protein drinks while at work. RD offered Ensure supplement to aid with meeting needs, pt declined states he was" supposed to be discharged already, but everyone in the hospital seems to be dragging their feet."  No weight history for review   Imaging with no acute findings, repeat MRI brain today with no acute findings. Pt cleared from neurology standpoint for planned EGD on 3/12.  I/Os: -382 ml since admit UOP: 500 ml x 24 hours  Medications reviewed and include: Folic  acid, Gabapentin, MVI, Thiamine, Venofer IV 300 mg once  Labs: K 3.9 (WNL)>3.3, Hgb 12.8 (L), AST 70 (H), ALT 47 (H), Total bilirubin 1.4 (H) 3/10 Mg 1.6 (L), Phosphorus 3.0 (WNL)  NUTRITION - FOCUSED PHYSICAL EXAM:  Unable to complete at this time, RD working remotely.   Diet Order:   Diet Order            Diet 2 gram sodium Room service appropriate? Yes; Fluid consistency: Thin  Diet effective now                 EDUCATION NEEDS:   Not appropriate for education at this time  Skin:  Skin Assessment: Reviewed RN Assessment  Last BM:  3/11  Height:   Ht Readings from Last 1 Encounters:  06/28/20 5\' 10"  (1.778 m)    Weight:   Wt Readings from Last 1 Encounters:  06/28/20 70.3 kg    BMI:  Body mass index is 22.24 kg/m.  Estimated Nutritional Needs:   Kcal:  2100-2300  Protein:  110-120  Fluid:  2.1 L   08/28/20, RD, LDN Clinical Nutrition After Hours/Weekend Pager # in Amion

## 2020-06-29 NOTE — Progress Notes (Signed)
Patient wanted to leave hospital, called primary MD he said could force patient to stay.  Patient singed AMA form and I removed IV.  Patient declined wheelchair and wanted to walk to exit ride waiting outside

## 2020-06-29 NOTE — Evaluation (Signed)
Physical Therapy Evaluation Patient Details Name: Sean Copeland MRN: [id number] DOB: October 30, 1970 Today's Date: 06/29/2020   History of Present Illness  Sean Copeland is a 50 y.o. male with medical history significant for psoriasis currently on stelara and prevously on methotrexate, alcoholic liver cirrhosis, alcohol use, tobacco abuse, history of DTs, history of abdominal ascites requiring paracentesis, presented to the emergency department for chief concerns of numbness to the left side of his body.  Clinical Impression  Patient received in bed, reports continued weakness, decreased sensation to left side. Arm and leg. Patient has sensation but reports burning pain to touch on left. Mild weakness to left UE and LE. Patient is independent with bed mobility and transfers, ambulated with supervision 150 feet, no AD. No lob, normal cadence. Patient does not require further PT intervention at this time. Safe to return home.        Follow Up Recommendations No PT follow up    Equipment Recommendations  Cane;Other (comment) (He has cane)    Recommendations for Other Services       Precautions / Restrictions Precautions Precautions: Fall Restrictions Weight Bearing Restrictions: No      Mobility  Bed Mobility Overal bed mobility: Independent                  Transfers Overall transfer level: Independent                  Ambulation/Gait Ambulation/Gait assistance: Supervision Gait Distance (Feet): 150 Feet Assistive device: None Gait Pattern/deviations: WFL(Within Functional Limits) Gait velocity: normal   General Gait Details: Patient ambulating on heel of left foot, he says to keep knee straight. He is fearful of it buckling.  Stairs            Wheelchair Mobility    Modified Rankin (Stroke Patients Only)       Balance Overall balance assessment: Modified Independent                                           Pertinent Vitals/Pain  Pain Assessment: Faces Faces Pain Scale: Hurts a little bit Pain Location: L side burning to touch Pain Descriptors / Indicators: Burning Pain Intervention(s): Monitored during session    Home Living Family/patient expects to be discharged to:: Private residence Living Arrangements: Spouse/significant other Available Help at Discharge: Family;Available PRN/intermittently           Home Equipment: Gilmer Mor - single point      Prior Function Level of Independence: Independent         Comments: patient works full time, fully independent prior to admission.     Hand Dominance        Extremity/Trunk Assessment   Upper Extremity Assessment Upper Extremity Assessment: Defer to OT evaluation    Lower Extremity Assessment Lower Extremity Assessment: LLE deficits/detail LLE Sensation: WNL LLE Coordination: decreased gross motor    Cervical / Trunk Assessment Cervical / Trunk Assessment: Normal  Communication   Communication: No difficulties  Cognition Arousal/Alertness: Awake/alert Behavior During Therapy: WFL for tasks assessed/performed Overall Cognitive Status: Within Functional Limits for tasks assessed                                        General Comments General comments (skin integrity, edema, etc.): patient slightly unsteady  with standing feet together and semi tandem    Exercises     Assessment/Plan    PT Assessment Patent does not need any further PT services  PT Problem List Decreased strength;Decreased balance;Impaired sensation       PT Treatment Interventions      PT Goals (Current goals can be found in the Care Plan section)  Acute Rehab PT Goals Patient Stated Goal: to leave here asap PT Goal Formulation: With patient Time For Goal Achievement: 06/30/20 Potential to Achieve Goals: Good    Frequency     Barriers to discharge        Co-evaluation               AM-PAC PT "6 Clicks" Mobility  Outcome Measure  Help needed turning from your back to your side while in a flat bed without using bedrails?: None Help needed moving from lying on your back to sitting on the side of a flat bed without using bedrails?: None Help needed moving to and from a bed to a chair (including a wheelchair)?: None Help needed standing up from a chair using your arms (e.g., wheelchair or bedside chair)?: None Help needed to walk in hospital room?: None Help needed climbing 3-5 steps with a railing? : None 6 Click Score: 24    End of Session Equipment Utilized During Treatment: Gait belt Activity Tolerance: Patient tolerated treatment well Patient left: in bed;with call bell/phone within reach;with family/visitor present Nurse Communication: Mobility status PT Visit Diagnosis: Unsteadiness on feet (R26.81);Muscle weakness (generalized) (M62.81)    Time: 0174-9449 PT Time Calculation (min) (ACUTE ONLY): 18 min   Charges:   PT Evaluation $PT Eval Moderate Complexity: 1 Mod PT Treatments $Gait Training: 8-22 mins        Mabel Unrein, PT, GCS 06/29/20,9:48 AM

## 2020-07-02 NOTE — Discharge Summary (Signed)
Physician Discharge Summary  Sean Copeland NLZ:[id number] DOB: 24-Dec-1970 DOA: 06/28/2020  PCP: [facility]  Admit date: 06/28/2020 Discharge date: 07/02/2020  Admitted From: Home Disposition:  Home  Recommendations for Outpatient Follow-up:       Left AMA  Home Health:No Equipment/Devices:None  Discharge Condition:Stable CODE STATUS:Full Diet recommendation: Heart Healthy   Brief/Interim Summary: 50 y.o. male past medical history significant for psoriatic arthritis on Stelara previously on methotrexate, alcoholic liver cirrhosis, alcohol abuse history of DTs, also with recurrent paracentesis due to ascites.  Comes into the emergency room for numbness of the left side of her body.  He endorses that he had an episode of hematemesis around 6 AM and has had dark tarry stool for the past 2 days.   Discharge Diagnoses:  Principal Problem:   Hematemesis Active Problems:   TIA (transient ischemic attack)   Epistaxis   H/O cirrhosis   Left-sided weakness   Left arm numbness Hematemesis: Had an episode of epistaxis Dr. Janetta Hora GI was consulted recommended an EGD. Abdominal ultrasound was done that showed hepatic steatosis. The patient had no further episode of bleeding he decided to leave AGAINST MEDICAL ADVICE.  Left-sided weakness question due to cervical myelopathy: CT angio of the neck showed mild cervical spondylosis of C5-C6. 2 MRIs was done that did not show a stroke neurology was consulted recommended no further work-up. After having argument with his fiance she he left him AGAINST MEDICAL ADVICE.  History of cirrhosis of the liver due to alcohol abuse: No change made to his medication.  Chronic low back pain: No change made to his medication.  History of alcohol abuse: Was monitored CIWA protocol started on thiamine no signs of withdrawal.   Discharge Instructions   Allergies as of 06/29/2020      Reactions   Codeine       Medication List     You have not been prescribed any medications.     Allergies  Allergen Reactions  . Codeine     Consultations: Gastroenterology Neurology  Procedures/Studies: CT ANGIOGRAM HEAD NECK W WO CONTRAST  Result Date: 06/28/2020 CLINICAL DATA:  Woke today with numbness and tingling of the left side of the body. EXAM: CT ANGIOGRAPHY HEAD AND NECK TECHNIQUE: Multidetector CT imaging of the head and neck was performed using the standard protocol during bolus administration of intravenous contrast. Multiplanar CT image reconstructions and MIPs were obtained to evaluate the vascular anatomy. Carotid stenosis measurements (when applicable) are obtained utilizing NASCET criteria, using the distal internal carotid diameter as the denominator. CONTRAST:  24mL OMNIPAQUE IOHEXOL 350 MG/ML SOLN COMPARISON:  Head CT earlier same day.  MRI earlier same day. FINDINGS: CTA NECK FINDINGS Aortic arch: Normal. No visible atherosclerotic change. Branching pattern is normal. Right carotid system: Common carotid artery widely patent to the bifurcation. The carotid bifurcation is normal without soft or calcified plaque. Cervical ICA is normal. Left carotid system: Common carotid artery widely patent to the bifurcation. Carotid bifurcation is normal. No atherosclerotic plaque. Cervical ICA is normal. Vertebral arteries: Both vertebral artery origins are widely patent. Both vertebral arteries are normal through the cervical region to the foramen magnum. Skeleton: Mid cervical spondylosis with moderate stenosis at C5-6 and C6-7. Other neck: No mass or lymphadenopathy. Upper chest: Normal Review of the MIP images confirms the above findings CTA HEAD FINDINGS Anterior circulation: Both internal carotid arteries are patent through the skull base and siphon regions. The anterior and middle cerebral vessels are patent without proximal stenosis,  aneurysm or vascular malformation. No large or medium vessel occlusion. Posterior circulation:  Both vertebral arteries widely patent to the basilar. No basilar stenosis. Posterior circulation branch vessels appear normal. Venous sinuses: Patent and normal. Anatomic variants: None significant. Review of the MIP images confirms the above findings IMPRESSION: 1. Normal CT angiography of the neck and head. No intracranial large or medium vessel occlusion or correctable proximal stenosis. 2. 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 Electronically Signed   By: Paulina Fusi M.D.   On: 06/28/2020 20:05   CT Head Wo Contrast  Result Date: 06/28/2020 CLINICAL DATA:  Left-sided numbness and tingling since this a.m. EXAM: CT HEAD WITHOUT CONTRAST TECHNIQUE: Contiguous axial images were obtained from the base of the skull through the vertex without intravenous contrast. COMPARISON:  None. FINDINGS: Brain: No evidence of acute infarction, hemorrhage, hydrocephalus, extra-axial collection or mass lesion/mass effect. Vascular: No hyperdense vessel or unexpected calcification. Skull: Normal. Negative for fracture or focal lesion. Sinuses/Orbits: No acute finding. Other: None. IMPRESSION: No acute intracranial pathology. Electronically Signed   By: Maudry Mayhew MD   On: 06/28/2020 10:02   MR BRAIN WO CONTRAST  Result Date: 06/29/2020 EXAM: MRI HEAD WITHOUT CONTRAST TECHNIQUE: Multiplanar, multiecho pulse sequences of the brain and surrounding structures were obtained without intravenous contrast. COMPARISON:  None. FINDINGS: Per neurologist request limited scan performed with only diffusion, SWI and FLAIR sequences. Within this limitation: Brain: No acute infarction, hemorrhage, hydrocephalus, extra-axial collection or mass lesion. Vascular: Limited assessment without T2 sequence. Skull and upper cervical spine: Limited assessment without evidence of focal marrow abnormality. Sinuses/Orbits: Right inferior maxillary sinus retention cyst. Mild mucosal thickening of ethmoid air cells. Other: No  sizable mastoid effusions. IMPRESSION: No evidence of acute abnormality on this limited protocol MRI. No acute infarct. Electronically Signed   By: Feliberto Harts MD   On: 06/29/2020 07:30   MR BRAIN WO CONTRAST  Result Date: 06/28/2020 CLINICAL DATA:  Left-sided numbness EXAM: MRI HEAD WITHOUT CONTRAST TECHNIQUE: Multiplanar, multiecho pulse sequences of the brain and surrounding structures were obtained without intravenous contrast. COMPARISON:  None. FINDINGS: Brain: There is no acute infarction or intracranial hemorrhage. There is no intracranial mass, mass effect, or edema. There is no hydrocephalus or extra-axial fluid collection. Ventricles and sulci are normal in size and configuration. Vascular: Major vessel flow voids at the skull base are preserved. Skull and upper cervical spine: Normal marrow signal is preserved. Sinuses/Orbits: Minor mucosal thickening.  Orbits are unremarkable. Other: Sella is unremarkable.  Mastoid air cells are clear. IMPRESSION: No evidence of recent infarction, hemorrhage, or mass. Electronically Signed   By: Guadlupe Spanish M.D.   On: 06/28/2020 11:53   US LIVER DOPPLER  Result Date: 06/28/2020 CLINICAL DATA:  Abdominal pain, hemoptysis, history of GI bleed EXAM: DUPLEX ULTRASOUND OF LIVER TECHNIQUE: Color and duplex Doppler ultrasound was performed to evaluate the hepatic in-flow and out-flow vessels. COMPARISON:  None. FINDINGS: Liver: Increased echogenicity surface nodularity suggesting hepatic steatosis. No intrahepatic biliary dilatation. No large focal mass or intrahepatic biliary dilatation Main Portal Vein size: 1.7 cm Portal Vein Velocities Main Prox:  33 cm/sec Main Mid: 34 cm/sec Main Dist:  31 cm/sec Right: 24 cm/sec Left: 416 cm/sec Hepatic Vein Velocities Right:  27 cm/sec Middle:  24 cm/sec Left:  36 cm/sec IVC: Present and patent with normal respiratory phasicity. Hepatic Artery Velocity:  130 cm/sec Splenic Vein Velocity:  22 cm/sec Spleen: 7.9 cm x  11.7 cm x 5.5 cm with  a total volume of 267 cm^3 (411 cm^3 is upper limit normal) Portal Vein Occlusion/Thrombus: No Splenic Vein Occlusion/Thrombus: No Ascites: None Varices: None Gallbladder is nondistended but appears to contain small gallstones. Patent portal, hepatic and splenic veins with normal directional flow. Portal vein is dilated measuring 1.7 cm in diameter suggesting component of portal hypertension. No portal vein occlusion or thrombus. IMPRESSION: Findings suggesting hepatic cirrhosis. Dilated portal vein suggesting component of portal hypertension but with preserved hepatopetal flow. Electronically Signed   By: Judie Petit.  Shick M.D.   On: 06/28/2020 16:08    (Echo, Carotid, EGD, Colonoscopy, ERCP)    Subjective: No complaints  Discharge Exam: Vitals:   06/29/20 0110 06/29/20 0445  BP: 140/74 132/79  Pulse: 94 94  Resp: 18 16  Temp: 98.6 F (37 C) 98.4 F (36.9 C)  SpO2: 96% 97%   Vitals:   06/28/20 1518 06/28/20 2008 06/29/20 0110 06/29/20 0445  BP: 124/69 123/69 140/74 132/79  Pulse: (!) 104 (!) 103 94 94  Resp: Temp: 99.2 F (37.3 C) 98.7 F (37.1 C) 98.6 F (37 C) 98.4 F (36.9 C)  TempSrc: Oral Oral Oral Oral  SpO2: 98% 99% 96% 97%  Weight:      Height:        General: Pt is alert, awake, not in acute distress Cardiovascular: RRR, S1/S2 +, no rubs, no gallops Respiratory: CTA bilaterally, no wheezing, no rhonchi Abdominal: Soft, NT, ND, bowel sounds + Extremities: no edema, no cyanosis    The results of significant diagnostics from this hospitalization (including imaging, microbiology, ancillary and laboratory) are listed below for reference.     Microbiology: Recent Results (from the past 240 hour(s))  Resp Panel by RT-PCR (Flu A&B, Covid) Nasopharyngeal Swab     Status: None   Collection Time: 06/28/20  1:20 PM   Specimen: Nasopharyngeal Swab; Nasopharyngeal(NP) swabs in vial transport medium  Result Value Ref Range Status   SARS  Coronavirus 2 by RT PCR NEGATIVE NEGATIVE Final    Comment: (NOTE) SARS-CoV-2 target nucleic acids are NOT DETECTED.  The SARS-CoV-2 RNA is generally detectable in upper respiratory specimens during the acute phase of infection. The lowest concentration of SARS-CoV-2 viral copies this assay can detect is 138 copies/mL. A negative result does not preclude SARS-Cov-2 infection and should not be used as the sole basis for treatment or other patient management decisions. A negative result may occur with  improper specimen collection/handling, submission of specimen other than nasopharyngeal swab, presence of viral mutation(s) within the areas targeted by this assay, and inadequate number of viral copies(<138 copies/mL). A negative result must be combined with clinical observations, patient history, and epidemiological information. The expected result is Negative.  Fact Sheet for Patients:  BloggerCourse.com  Fact Sheet for Healthcare Providers:  SeriousBroker.it  This test is no t yet approved or cleared by the [place] FDA and  has been authorized for detection and/or diagnosis of SARS-CoV-2 by FDA under an Emergency Use Authorization (EUA). This EUA will remain  in effect (meaning this test can be used) for the duration of the COVID-19 declaration under Section 564(b)(1) of the Act, 21 U.S.C.section 360bbb-3(b)(1), unless the authorization is terminated  or revoked sooner.       Influenza A by PCR NEGATIVE NEGATIVE Final   Influenza B by PCR NEGATIVE NEGATIVE Final    Comment: (NOTE) The Xpert Xpress SARS-CoV-2/FLU/RSV plus assay is intended as an aid in the diagnosis of influenza from Nasopharyngeal swab  specimens and should not be used as a sole basis for treatment. Nasal washings and aspirates are unacceptable for Xpert Xpress SARS-CoV-2/FLU/RSV testing.  Fact Sheet for  Patients: BloggerCourse.com  Fact Sheet for Healthcare Providers: SeriousBroker.it  This test is not yet approved or cleared by the [place] FDA and has been authorized for detection and/or diagnosis of SARS-CoV-2 by FDA under an Emergency Use Authorization (EUA). This EUA will remain in effect (meaning this test can be used) for the duration of the COVID-19 declaration under Section 564(b)(1) of the Act, 21 U.S.C. section 360bbb-3(b)(1), unless the authorization is terminated or revoked.  Performed at [facility], [address] [place], [place] [address]      Labs: BNP (last 3 results) No results for input(s): BNP in the last 8760 hours. Basic Metabolic Panel: Recent Labs  Lab 06/28/20 0851 06/28/20 1334 06/29/20 0416  NA 141 139 138  K 3.6 3.3* 3.9  CL 102 103 103  CO2 29 25 26   GLUCOSE 111* 94 91  BUN 9 8 11   CREATININE 0.98 0.83 0.81  CALCIUM 8.7* 8.6* 9.1  MG  --  1.6*  --   PHOS  --  3.0  --    Liver Function Tests: Recent Labs  Lab 06/28/20 0851 06/28/20 1334  AST 77* 70*  ALT 48* 47*  ALKPHOS 89 80  BILITOT 1.1 1.4*  PROT 8.1 7.6  ALBUMIN 3.9 3.8   No results for input(s): LIPASE, AMYLASE in the last 168 hours. Recent Labs  Lab 06/28/20 1334  AMMONIA 14   CBC: Recent Labs  Lab 06/28/20 0851 06/28/20 1334 06/29/20 0416  WBC 8.3 9.5 10.2  NEUTROABS 6.0  --   --   HGB 13.6 12.6* 12.8*  HCT 42.3 38.1* 39.2  MCV 91.6 91.1 91.0  PLT 209 200 184   Cardiac Enzymes: No results for input(s): CKTOTAL, CKMB, CKMBINDEX, TROPONINI in the last 168 hours. BNP: Invalid input(s): POCBNP CBG: No results for input(s): GLUCAP in the last 168 hours. D-Dimer No results for input(s): DDIMER in the last 72 hours. Hgb A1c No results for input(s): HGBA1C in the last 72 hours. Lipid Profile No results for input(s): CHOL, HDL, LDLCALC, TRIG, CHOLHDL, LDLDIRECT in the last 72 hours. Thyroid  function studies No results for input(s): TSH, T4TOTAL, T3FREE, THYROIDAB in the last 72 hours.  Invalid input(s): FREET3 Anemia work up No results for input(s): VITAMINB12, FOLATE, FERRITIN, TIBC, IRON, RETICCTPCT in the last 72 hours. Urinalysis No results found for: COLORURINE, APPEARANCEUR, LABSPEC, PHURINE, GLUCOSEU, HGBUR, BILIRUBINUR, KETONESUR, PROTEINUR, UROBILINOGEN, NITRITE, LEUKOCYTESUR Sepsis Labs Invalid input(s): PROCALCITONIN,  WBC,  LACTICIDVEN Microbiology Recent Results (from the past 240 hour(s))  Resp Panel by RT-PCR (Flu A&B, Covid) Nasopharyngeal Swab     Status: None   Collection Time: 06/28/20  1:20 PM   Specimen: Nasopharyngeal Swab; Nasopharyngeal(NP) swabs in vial transport medium  Result Value Ref Range Status   SARS Coronavirus 2 by RT PCR NEGATIVE NEGATIVE Final    Comment: (NOTE) SARS-CoV-2 target nucleic acids are NOT DETECTED.  The SARS-CoV-2 RNA is generally detectable in upper respiratory specimens during the acute phase of infection. The lowest concentration of SARS-CoV-2 viral copies this assay can detect is 138 copies/mL. A negative result does not preclude SARS-Cov-2 infection and should not be used as the sole basis for treatment or other patient management decisions. A negative result may occur with  improper specimen collection/handling, submission of specimen other than nasopharyngeal swab, presence of viral mutation(s)  within the areas targeted by this assay, and inadequate number of viral copies(<138 copies/mL). A negative result must be combined with clinical observations, patient history, and epidemiological information. The expected result is Negative.  Fact Sheet for Patients:  BloggerCourse.comhttps://www.fda.gov/media/152166/download  Fact Sheet for Healthcare Providers:  SeriousBroker.ithttps://www.fda.gov/media/152162/download  This test is no t yet approved or cleared by the [place] FDA and  has been authorized for detection and/or diagnosis of  SARS-CoV-2 by FDA under an Emergency Use Authorization (EUA). This EUA will remain  in effect (meaning this test can be used) for the duration of the COVID-19 declaration under Section 564(b)(1) of the Act, 21 U.S.C.section 360bbb-3(b)(1), unless the authorization is terminated  or revoked sooner.       Influenza A by PCR NEGATIVE NEGATIVE Final   Influenza B by PCR NEGATIVE NEGATIVE Final    Comment: (NOTE) The Xpert Xpress SARS-CoV-2/FLU/RSV plus assay is intended as an aid in the diagnosis of influenza from Nasopharyngeal swab specimens and should not be used as a sole basis for treatment. Nasal washings and aspirates are unacceptable for Xpert Xpress SARS-CoV-2/FLU/RSV testing.  Fact Sheet for Patients: BloggerCourse.comhttps://www.fda.gov/media/152166/download  Fact Sheet for Healthcare Providers: SeriousBroker.ithttps://www.fda.gov/media/152162/download  This test is not yet approved or cleared by the [place] FDA and has been authorized for detection and/or diagnosis of SARS-CoV-2 by FDA under an Emergency Use Authorization (EUA). This EUA will remain in effect (meaning this test can be used) for the duration of the COVID-19 declaration under Section 564(b)(1) of the Act, 21 U.S.C. section 360bbb-3(b)(1), unless the authorization is terminated or revoked.  Performed at [facility], [address] [place], [place] [id number]      Time coordinating discharge: Over 30 minutes  SIGNED:   Marinda ElkAbraham Feliz Ortiz, MD  [facility] 07/02/2020, 11:48 AM Pager   If 7PM-7AM, please contact night-coverage [email or web address] Password [email or web address]
